# Patient Record
Sex: Female | Born: 1985 | Race: White | Hispanic: No | Marital: Married | Smoking: Former smoker
Health system: Southern US, Community
[De-identification: ages and names within clinical notes are randomized; demographics above are authoritative.]

## PROBLEM LIST (undated history)

## (undated) ENCOUNTER — Inpatient Hospital Stay (HOSPITAL_COMMUNITY): Payer: Self-pay

## (undated) DIAGNOSIS — T7840XA Allergy, unspecified, initial encounter: Secondary | ICD-10-CM

## (undated) DIAGNOSIS — F419 Anxiety disorder, unspecified: Secondary | ICD-10-CM

## (undated) HISTORY — PX: NO PAST SURGERIES: SHX2092

## (undated) HISTORY — DX: Allergy, unspecified, initial encounter: T78.40XA

## (undated) HISTORY — DX: Anxiety disorder, unspecified: F41.9

---

## 2006-07-02 ENCOUNTER — Other Ambulatory Visit: Admission: RE | Admit: 2006-07-02 | Discharge: 2006-07-02 | Payer: Self-pay | Admitting: Obstetrics and Gynecology

## 2006-11-01 ENCOUNTER — Inpatient Hospital Stay (HOSPITAL_COMMUNITY): Admission: AD | Admit: 2006-11-01 | Discharge: 2006-11-01 | Payer: Self-pay

## 2006-12-25 ENCOUNTER — Inpatient Hospital Stay (HOSPITAL_COMMUNITY): Admission: AD | Admit: 2006-12-25 | Discharge: 2006-12-27 | Payer: Self-pay | Admitting: Obstetrics and Gynecology

## 2008-02-23 ENCOUNTER — Encounter: Admission: RE | Admit: 2008-02-23 | Discharge: 2008-02-23 | Payer: Self-pay | Admitting: Emergency Medicine

## 2008-02-23 ENCOUNTER — Other Ambulatory Visit: Admission: RE | Admit: 2008-02-23 | Discharge: 2008-02-23 | Payer: Self-pay | Admitting: Diagnostic Radiology

## 2008-02-23 ENCOUNTER — Encounter (INDEPENDENT_AMBULATORY_CARE_PROVIDER_SITE_OTHER): Payer: Self-pay | Admitting: Diagnostic Radiology

## 2008-03-16 ENCOUNTER — Inpatient Hospital Stay (HOSPITAL_COMMUNITY): Admission: AD | Admit: 2008-03-16 | Discharge: 2008-03-17 | Payer: Self-pay | Admitting: Obstetrics and Gynecology

## 2008-08-30 ENCOUNTER — Encounter: Admission: RE | Admit: 2008-08-30 | Discharge: 2008-08-30 | Payer: Self-pay | Admitting: Emergency Medicine

## 2008-09-28 ENCOUNTER — Inpatient Hospital Stay (HOSPITAL_COMMUNITY): Admission: AD | Admit: 2008-09-28 | Discharge: 2008-09-28 | Payer: Self-pay | Admitting: Obstetrics and Gynecology

## 2009-01-21 ENCOUNTER — Inpatient Hospital Stay (HOSPITAL_COMMUNITY): Admission: AD | Admit: 2009-01-21 | Discharge: 2009-01-21 | Payer: Self-pay | Admitting: Obstetrics and Gynecology

## 2009-01-26 ENCOUNTER — Inpatient Hospital Stay (HOSPITAL_COMMUNITY): Admission: AD | Admit: 2009-01-26 | Discharge: 2009-01-28 | Payer: Self-pay | Admitting: Obstetrics and Gynecology

## 2009-07-07 ENCOUNTER — Encounter: Admission: RE | Admit: 2009-07-07 | Discharge: 2009-07-07 | Payer: Self-pay | Admitting: Emergency Medicine

## 2009-09-06 ENCOUNTER — Other Ambulatory Visit: Admission: RE | Admit: 2009-09-06 | Discharge: 2009-09-06 | Payer: Self-pay | Admitting: Interventional Radiology

## 2009-09-06 ENCOUNTER — Encounter (INDEPENDENT_AMBULATORY_CARE_PROVIDER_SITE_OTHER): Payer: Self-pay | Admitting: Interventional Radiology

## 2009-09-06 ENCOUNTER — Encounter: Admission: RE | Admit: 2009-09-06 | Discharge: 2009-09-06 | Payer: Self-pay | Admitting: Internal Medicine

## 2011-01-07 ENCOUNTER — Encounter: Payer: Self-pay | Admitting: Emergency Medicine

## 2011-04-02 LAB — CBC
HCT: 39.2 % (ref 36.0–46.0)
Hemoglobin: 13.4 g/dL (ref 12.0–15.0)
MCHC: 33.4 g/dL (ref 30.0–36.0)
MCHC: 34.1 g/dL (ref 30.0–36.0)
MCV: 89.8 fL (ref 78.0–100.0)
Platelets: 187 10*3/uL (ref 150–400)
Platelets: 208 10*3/uL (ref 150–400)
RBC: 3.59 MIL/uL — ABNORMAL LOW (ref 3.87–5.11)
RBC: 4.36 MIL/uL (ref 3.87–5.11)
RDW: 14.1 % (ref 11.5–15.5)
RDW: 14.3 % (ref 11.5–15.5)
WBC: 14.1 10*3/uL — ABNORMAL HIGH (ref 4.0–10.5)

## 2011-04-02 LAB — CREATININE, URINE, 24 HOUR
Collection Interval-UCRE24: 24 hours
Creatinine, 24H Ur: 1855 mg/d — ABNORMAL HIGH (ref 700–1800)
Creatinine, Urine: 89.2 mg/dL
Urine Total Volume-UCRE24: 2080 mL

## 2011-04-02 LAB — PROTEIN, URINE, 24 HOUR
Collection Interval-UPROT: 24 hours
Protein, 24H Urine: 62 mg/d (ref 50–100)
Protein, Urine: 3 mg/dL
Urine Total Volume-UPROT: 2080 mL

## 2011-04-02 LAB — RPR: RPR Ser Ql: NONREACTIVE

## 2011-04-30 NOTE — H&P (Signed)
NAMESHANTARA, GOOSBY          ACCOUNT NO.:  [id number]   MEDICAL RECORD NO.:  [id number]          PATIENT TYPE:  INP   LOCATION:  9171                          [facility]:  [facility]   PHYSICIAN:  Osborn Coho, M.D.   DATE OF BIRTH:  [birth year]   DATE OF ADMISSION:  01/26/2009  DATE OF DISCHARGE:                              HISTORY & PHYSICAL   HISTORY:  Ms. Townsel is a 25 year old gravida 3, para 1-0-1-1 at 39.[redacted]  weeks gestation.  She is followed by the midwives at [facility] and presents today in active labor with a cervix dilated 5-6 cm.  Ms. Coventry pregnancy is remarkable for:  1. Conception while breast feeding.  2. First trimester ovarian cyst.  3. First trimester spotting.   LABORATORY DATA:  Ms. Grimmett's prenatal labs include an initial  hemoglobin of 12.5, hematocrit 38.2, platelets 212,000.  Blood type O+,  antibody screen negative, RPR nonreactive, rubella titer immune,  hepatitis B negative, HIV nonreactive.  Cystic fibrosis screen negative.  Second trimester Glucola normal.  Second trimester hemoglobin 11.6.  Third trimester cultures for Chlamydia, gonorrhea and GBS all negative.   HISTORY OF PRESENT PREGNANCY:  Ms. Ozbun entered into OB care at 12  weeks.  She declined a first trimester screen and a quad screen.  At 18  weeks, she was treated for a yeast infection.  At 19.5 weeks, she had an  anatomy ultrasound that showed an estimated fetal weight of 11 ounces, a  posterior placenta with a three-vessel cord, a cervix 4.74 cm long and a  fluid pocket 5.41 cm.  All cardiac anatomy was seen and all fetal  anatomy on the scan was normal.  At 23.6 weeks, she had an episode of  back pain that did not seem to be precipitated by any activity.  She was  given some ibuprofen and Flexeril.  Urine culture was sent which came  back negative.  She declined H1N1 vaccination.  She had her Glucola done  at 28.5 weeks.  It came back normal with a  hemoglobin at that time of  11.6 and a negative RPR as well.  Her Glucola was 124.  At 33 weeks, she  got a refill on her Flexeril which she was taking for back pain and her  pregnancy progressed without any further problems.  She had negative  cultures for chlamydia, gonorrhea and GBS.  On December 20, 2008 at 36  weeks, she states she began contracting in the middle of the night and  intensity and frequency picked up this morning.   CURRENT MEDICATIONS:  1. Prenatal vitamin.  2. Flexeril p.r.n.   OBSTETRICAL HISTORY:  Gravida 1 resulted in the birth of a son at 16  weeks named [place].  He weighed 8 pounds 9 ounces.  Rhett Bannister 2 was a  spontaneous first trimester loss in April 2009.  Gravida 3 is current  pregnancy.   ALLERGIES:  Ms. Scherger is sensitive to HYDROCODONE which gives her  severe nausea and vomiting.  She does not have any other drug allergies,  latex allergies or food allergies.   MEDICAL HISTORY:  Ms. Wiater medical history includes having some  thyroid nodules in the past.  She did see an endocrinologist and had  normal TSH levels on evaluation in April of last year.  She has no other  ongoing medical problems.   SURGICAL HISTORY:  She has never had surgery.   FAMILY HISTORY:  Her maternal grandfather and paternal grandfather both  have cardiac disease.  Her mother has chronic hypertension as well as  anemia, COPD and thyroid problems.  Her mother also had a kidney  transplant 10 years ago and is currently on dialysis.  Her mother had  breast cancer at age 73 before menopause.  There are no other health  problems in her family.   GENETIC HISTORY:  The patient has a negative cystic fibrosis screen and  as mentioned family history of premenopausal breast cancer.  Father of  the baby has no contributing genetic factors.   SOCIAL HISTORY:  Ms. Nilsen is married to [employer]  They are  both Caucasian.  They state their religion as [place].  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  Her husband has a  high school education and some college, and works for [employer].  The  patient denies use of alcohol, tobacco or street drugs during pregnancy.   PHYSICAL EXAMINATION:  GENERAL:  Her physical exam is normal today.  VITAL SIGNS:  She is afebrile with stable vital signs.  HEENT:  Within normal limits.  LUNGS:  Clear to auscultation.  HEART:  Regular rate and rhythm without murmur.  BREASTS:  Soft, nontender.  ABDOMEN:  Soft and nontender with a gravid uterus with a fundal height  appropriate for gestational age and a soft resting tone between  contractions.  EXTREMITIES:  There is some mild peripheral edema of the upper  extremities only.  No edema of the lower extremities.  DTRs +2 +2,  negative Homans and clonus.  GU:  Cervix is 5-6 cm dilated, 100% effaced, -3 station, slightly  posterior, very soft and stretchy.  Fetal heart rate 140, very reactive,  very active fetus.  Contractions every 3-5 minutes lasting 60-100  seconds and very strong.   IMPRESSION:  A 25 year old G3, P 1-0-1-1 at 39.5 weeks active labor,  reassuring fetal heart rate.   PLAN:  Admit to birthing suite for CNM management and routine orders.  Dr. Su Hilt notified.      Eulogio Bear, CNM      Osborn Coho, M.D.  Electronically Signed    JM/MEDQ  D:  01/26/2009  T:  01/26/2009  Job:  [id number]

## 2011-05-03 NOTE — H&P (Signed)
April Sanchez, April Sanchez            ACCOUNT NO.:  [id number]   MEDICAL RECORD NO.:  [id number]          PATIENT TYPE:  MAT   LOCATION:  MATC                          [facility]ILITY:  [facility]   PHYSICIAN:  Crist Fat. Rivard, M.D. DATE OF BIRTH:  24-Feb-1986   DATE OF ADMISSION:  12/25/2006  DATE OF DISCHARGE:                              HISTORY & PHYSICAL   HISTORY OF PRESENT ILLNESS:  Ms. April Sanchez is a 25 year old gravida 1,  para 0 at 39-4/7 weeks who presents today with uterine contractions  every 5 minutes at home times several hours.  The patient denies any  leaking or bleeding. She reports positive fetal movement.  Pregnancy has  been remarkable for:  1. Questionable last menstrual period.  2. First trimester bleeding.   LABORATORY DATA:  Prenatal labs:  Blood type is O positive, RH antibody  negative, VDRL nonreactive, Rubella titer positive, hepatitis B surface  antigen negative.  HIV nonreactive.  Cystic fibrosis testing negative.  Gonorrhea and Chlamydia cultures were negative in July.  Pap smear was  normal.  Hemoglobin upon entering practice was 13.7; it was 12.4 at 27  weeks.  Glucola was normal.  The patient declined quadruple screen.  Group B Strep culture was negative at 36 weeks.  EDC of 12/28/2006 was  established by 14-week ultrasound.   OBSTETRICAL HISTORY:  The patient entered care at 14 weeks.  Ultrasound  was done at that time secondary to questionable dating.  She declined  quadruple screen.  She had an ultrasound at 19 weeks showing normal  growth and development.  Placenta was anterior.  She was having some  issues with hemorrhoids and varicose veins in her right leg.  These were  stable throughout the rest of her pregnancy.  Her Glucola was normal.  She did have some reflux and was placed on Zantac.  The rest of her  pregnancy was essentially uncomplicated.  The patient is a primigravida.   PAST MEDICAL HISTORY:  She has no history of diabetes, cardiovascular  disease, hypertension or any other problems.  She has never had surgery.   ALLERGIES:  She has no known medication allergies.   FAMILY HISTORY:  Maternal grandfather and paternal grandfather had heart  disease.  Mother has hypertension.  Maternal grandmother has anemia.  Mother and maternal grandmother had chronic obstructive pulmonary  disease.  Her mother has thyroid problems.  Mother also had a kidney  transplant 10 years ago and now is on dialysis.  Her mother also has  premenopausal breast cancer.   SOCIAL HISTORY:  The patient is single.  Father of the baby is involved  and supportive.  His name is April Sanchez.  The patient is Caucasian  of the [place] faith.  She has one year of college.  She is employed  at a [employer].  Her partner also has some college and he is also  employed at the [employer].  She has been followed by the Certified  Nurse Midwife Service at [facility].  She denies any alcohol,  drug or tobacco use during this pregnancy.   PHYSICAL EXAMINATION:  VITAL SIGNS:  Stable.  The patient is afebrile.  HEENT:  Within normal limits.  LUNGS:  Lungs are essentially clear.  HEART:  Regular rate and rhythm without murmurs.  BREASTS:  Soft, nontender.  ABDOMEN:  Fundal height is approximately 38 cm.  Estimated fetal weight  is 7 to 7-1/2 pounds.  Uterine contractions are every two to five  minutes, 40 to 60 seconds in duration, moderate to strong quality.  Fetal heart rate is reactive with no decelerations.  PELVIC: Cervix is 4 to 5 cm, 90%, vertex, minus 1 station with intact  bag of water.  EXTREMITIES:  Deep tendon reflexes are 2+ without clonus.  There is a  trace edema noted.   IMPRESSION:  1. Intrauterine pregnancy at 39-4/7 weeks.  2. Early active labor.  3. Negative Group B Strep.   PLAN:  1. Admit to birthing suite for consult with Dr. Estanislado Pandy as attending      physician.  2. Routine Certified Nurse Midwife orders.  3. Pain  medication p.r.n.      April Sanchez, C.N.M.      Crist Fat Rivard, M.D.  Electronically Signed    VLL/MEDQ  D:  12/25/2006  T:  12/25/2006  Job:  [id number]

## 2011-07-01 ENCOUNTER — Emergency Department: Payer: Self-pay | Admitting: Unknown Physician Specialty

## 2011-09-10 LAB — CBC
HCT: 36.2
Hemoglobin: 12.6
MCV: 88.6
RBC: 4.09
WBC: 9.2

## 2012-09-14 ENCOUNTER — Telehealth: Payer: Self-pay

## 2012-09-14 NOTE — Telephone Encounter (Signed)
Dr. Milus Glazier   Pt is requesting we contact her childrens Dr. Eddie Candle  [phone] to verify they have strep, then call meds for [facility] to [facility] [address] in [place]   [phone]  Cell phone   Verified this is the correct phone number for patient.

## 2012-09-14 NOTE — Telephone Encounter (Signed)
Pt states she sees dr l or daub. States both of her children have strep and she is having symptoms. Pt has no insurance and cannot afford an ov currently. Asks dr l or daub if they can write her an antibiotic in case she has strep. notified her we usually require ov to rx an antibiotic but she would like to ask them in case they can help her.   Pt best: [phone] [facility] [address] in [place]  bf

## 2012-09-15 NOTE — Telephone Encounter (Signed)
Advised patient she would need to be seen in our office.  Patient stated that Dr. Milus Glazier has treated her in the past.  Advised patient he is out of the office and we will be unable to give her antibiotic, she must be seen. Patient states understanding.   Patient has not been seen in our office since 01/2009.

## 2012-10-31 ENCOUNTER — Ambulatory Visit: Payer: Self-pay | Admitting: Family Medicine

## 2012-10-31 VITALS — BP 119/82 | HR 76 | Temp 97.4°F | Resp 18 | Ht 66.0 in | Wt 158.0 lb

## 2012-10-31 DIAGNOSIS — B085 Enteroviral vesicular pharyngitis: Secondary | ICD-10-CM

## 2012-10-31 DIAGNOSIS — J029 Acute pharyngitis, unspecified: Secondary | ICD-10-CM

## 2012-10-31 DIAGNOSIS — T17208A Unspecified foreign body in pharynx causing other injury, initial encounter: Secondary | ICD-10-CM

## 2012-10-31 DIAGNOSIS — T700XXA Otitic barotrauma, initial encounter: Secondary | ICD-10-CM

## 2012-10-31 MED ORDER — AMOXICILLIN 875 MG PO TABS: 875.0000 mg | ORAL_TABLET | Freq: Two times a day (BID) | ORAL | Status: DC

## 2012-10-31 NOTE — Progress Notes (Signed)
Patient one another physician opinion and Dr. Dareen Piano asked me to see her. The history as noted above. She has a foreign or food bulk sensation in the back of her throat on the left. She's been reaching back they're digging with her finger.  Objective: She has ecchymosis on the soft palate were she's been digging at herself. Her tonsils are red and swollen. I used the indirect laryngoscope to look back down into her throat. See down into the epiglottis and pharyngeal area area. There is no foreign body or food particles there. Is able to look behind the tonsils, and on the left also did see some thick white mucus stuck in the grip of the tonsil. I think that is what she is feeling. She was anesthetized with Xylocaine. I was able to use a Q-tip and remove some of the thick mucus.  Assessment: Mucus body in tonsillar crypt Pharyngitis secondary to digital manipulation by patient  Plan: Decided to put her on amoxicillin 875 twice a day If symptoms persist she is to see him is that Dr.

## 2012-10-31 NOTE — Progress Notes (Signed)
[facility] [address] [place] [address] [phone]  Date:  10/31/2012   Name:  April Sanchez   DOB:  [birth year]   MRN:  [id number]  PCP:  No primary provider on file.    Chief Complaint: food stuck in throat x 3 days   History of Present Illness:  April Sanchez is a 26 y.o. very pleasant female patient who presents with the following:  For the past three days has felt a "ball of food" stuck in the back of her throat on the left side.  She claims to be able to feel the food bolus with her finger transorally.  She denies waterbrash or heartburn or indigestion. No use ASA or NSAID.  Uses caffeine but no alcohol.  Says this has happened previously and the bolus of food either is regurgitated or passes down her esophagus.  Has swallowed liquids since this food is stuck.  Says that she has eaten sparingly and thinks it may be something like a cracker.  Denies fish or chicken as a meal.  Had a URI past several days with nasal drainage and cough and no fever.  Symptoms have resolved.  She is convinced that her throat is swollen from the infection and that is what has provoked this episode.  She claims to have instrumented her throat with qtips and her fingers and expressed small amounts of food from a hidden location behind and beneath a tonsil.  There is no problem list on file for this patient.   No past medical history on file.  No past surgical history on file.  History  Substance Use Topics  . Smoking status: Former Games developer  . Smokeless tobacco: Not on file  . Alcohol Use: Not on file    No family history on file.  No Known Allergies  Medication list has been reviewed and updated.  No current outpatient prescriptions on file prior to visit.    Review of Systems:  As per HPI, otherwise negative.    Physical Examination: Filed Vitals:   10/31/12 1804  BP: 119/82  Pulse: 76  Temp: 97.4 F (36.3 C)  Resp: 18   Filed  Vitals:   10/31/12 1804  Height: 5\' 6"  (1.676 m)  Weight: 158 lb (71.668 kg)   Body mass index is 25.50 kg/(m^2). Ideal Body Weight: Weight in (lb) to have BMI = 25: 154.6   GEN: WDWN, NAD, Non-toxic, A & O x 3 HEENT: Atraumatic, Normocephalic. Neck supple. No masses, No LAD.  Oropharynx negative.  No foreign body or swelling.  Tonsils enlarged but not inflammed Ears and Nose: No external deformity. CV: RRR, No M/G/R. No JVD. No thrill. No extra heart sounds. PULM: CTA B, no wheezes, crackles, rhonchi. No retractions. No resp. distress. No accessory muscle use. ABD: S, NT, ND, +BS. No rebound. No HSM. EXTR: No c/c/e NEURO Normal gait.  PSYCH: Normally interactive. Conversant. Not depressed or anxious appearing.  Calm demeanor.    Assessment and Plan: Possible oropharyngeal foreign body Recommended that she go to the ER for further evaluation and fiberoptic endoscopy.  She refused due to financial concerns. Refer to ENT  Carmelina Dane, MD

## 2012-10-31 NOTE — Addendum Note (Signed)
Addended by: HOPPER, DAVID H on: 10/31/2012 07:17 PM   Modules accepted: Orders

## 2012-10-31 NOTE — Patient Instructions (Addendum)
Drink lots of fluids  Take antibiotics  If symptoms persist see her nose and throat doctor  Use lozenges if needed just to soothe the symptoms.

## 2013-03-19 ENCOUNTER — Ambulatory Visit: Payer: Self-pay

## 2013-04-30 ENCOUNTER — Ambulatory Visit (INDEPENDENT_AMBULATORY_CARE_PROVIDER_SITE_OTHER): Payer: BC Managed Care – PPO | Admitting: Emergency Medicine

## 2013-04-30 VITALS — BP 118/80 | HR 91 | Temp 98.7°F | Resp 16 | Ht 66.5 in | Wt 146.0 lb

## 2013-04-30 DIAGNOSIS — E041 Nontoxic single thyroid nodule: Secondary | ICD-10-CM

## 2013-04-30 DIAGNOSIS — J020 Streptococcal pharyngitis: Secondary | ICD-10-CM

## 2013-04-30 MED ORDER — PENICILLIN V POTASSIUM 500 MG PO TABS: 500.0000 mg | ORAL_TABLET | Freq: Four times a day (QID) | ORAL | Status: DC

## 2013-04-30 NOTE — Patient Instructions (Addendum)

## 2013-04-30 NOTE — Progress Notes (Signed)
[facility] [address], [place] [address] [phone]  Date:  04/30/2013   Name:  April Sanchez   DOB:  November 23, 1986   MRN:  [id number]  PCP:  No primary provider on file.    Chief Complaint: Sore Throat   History of Present Illness:  Bridgette Wolden Bannister is a 27 y.o. very pleasant female patient who presents with the following:  Ill today with sore throat.  No fever or chills.  Fatigued.  No coryza or cough, nausea or vomiting.  No rash.  Children treated for strep four days ago.  History of thyroid nodules biopsied in past following ultrasound. Now has insurance again and wants to be checked.  Not symptomatic  No improvement with over the counter medications or other home remedies. Denies other complaint or health concern today.   There are no active problems to display for this patient.   Past Medical History  Diagnosis Date  . Allergy     History reviewed. No pertinent past surgical history.  History  Substance Use Topics  . Smoking status: Former Games developer  . Smokeless tobacco: Not on file  . Alcohol Use: Not on file    Family History  Problem Relation Age of Onset  . Arthritis Mother   . Cancer Mother     breast  . Kidney failure Mother   . Arthritis Father     No Known Allergies  Medication list has been reviewed and updated.  Current Outpatient Prescriptions on File Prior to Visit  Medication Sig Dispense Refill  . amoxicillin (AMOXIL) 875 MG tablet Take 1 tablet (875 mg total) by mouth 2 (two) times daily.  14 tablet  0   No current facility-administered medications on file prior to visit.    Review of Systems:  As per HPI, otherwise negative.    Physical Examination: Filed Vitals:   04/30/13 1448  BP: 118/80  Pulse: 91  Temp: 98.7 F (37.1 C)  Resp: 16   Filed Vitals:   04/30/13 1448  Height: 5' 6.5" (1.689 m)  Weight: 146 lb (66.225 kg)   Body mass index is 23.21 kg/(m^2). Ideal Body Weight: Weight in  (lb) to have BMI = 25: 156.9  GEN: WDWN, NAD, Non-toxic, A & O x 3 HEENT: Atraumatic, Normocephalic. Neck supple. Thyroid enlarged and palpable easily, No LAD.  Erythematous posterior pharynx Ears and Nose: No external deformity. CV: RRR, No M/G/R. No JVD. No thrill. No extra heart sounds. PULM: CTA B, no wheezes, crackles, rhonchi. No retractions. No resp. distress. No accessory muscle use. ABD: S, NT, ND, +BS. No rebound. No HSM. EXTR: No c/c/e NEURO Normal gait.  PSYCH: Normally interactive. Conversant. Not depressed or anxious appearing.  Calm demeanor.    Assessment and Plan: Pharyngitis Thyromegaly Sonogram Thyroid studies   Signed,  Phillips Odor, MD

## 2013-05-14 ENCOUNTER — Ambulatory Visit
Admission: RE | Admit: 2013-05-14 | Discharge: 2013-05-14 | Disposition: A | Discharge status: Home / Self Care | Payer: BC Managed Care – PPO | Source: Ambulatory Visit | Attending: Emergency Medicine | Admitting: Emergency Medicine

## 2013-05-14 DIAGNOSIS — E041 Nontoxic single thyroid nodule: Secondary | ICD-10-CM

## 2013-05-20 ENCOUNTER — Telehealth: Payer: Self-pay

## 2013-05-20 NOTE — Telephone Encounter (Signed)
Pt called for results of her xray that she was referred out to. [phone]

## 2013-05-21 NOTE — Telephone Encounter (Signed)
Please review Dr Dareen Piano and we will contact pt. Thanks!

## 2013-05-21 NOTE — Telephone Encounter (Signed)
Really unchanged from previous study.

## 2013-05-22 NOTE — Telephone Encounter (Signed)
Patient called again for thyroid u/s results. Needs a clinical staff to call her back. [phone]. It has been over 24 hrs since she has been waiting.

## 2013-05-23 ENCOUNTER — Telehealth: Payer: Self-pay

## 2013-05-23 NOTE — Telephone Encounter (Signed)
Pt is returning a missed call Call back number is [phone]

## 2013-05-23 NOTE — Telephone Encounter (Signed)
Thanks patient advised.  

## 2013-05-24 NOTE — Telephone Encounter (Signed)
She has been advised of the [place] results

## 2013-05-27 ENCOUNTER — Ambulatory Visit (INDEPENDENT_AMBULATORY_CARE_PROVIDER_SITE_OTHER): Payer: BC Managed Care – PPO | Admitting: Emergency Medicine

## 2013-05-27 ENCOUNTER — Encounter: Payer: Self-pay | Admitting: Emergency Medicine

## 2013-05-27 VITALS — BP 122/74 | HR 80 | Temp 99.3°F | Resp 16 | Ht 66.0 in | Wt 136.0 lb

## 2013-05-27 DIAGNOSIS — E049 Nontoxic goiter, unspecified: Secondary | ICD-10-CM

## 2013-05-27 DIAGNOSIS — J029 Acute pharyngitis, unspecified: Secondary | ICD-10-CM

## 2013-05-27 DIAGNOSIS — E042 Nontoxic multinodular goiter: Secondary | ICD-10-CM

## 2013-05-27 DIAGNOSIS — R599 Enlarged lymph nodes, unspecified: Secondary | ICD-10-CM

## 2013-05-27 DIAGNOSIS — L989 Disorder of the skin and subcutaneous tissue, unspecified: Secondary | ICD-10-CM

## 2013-05-27 DIAGNOSIS — R590 Localized enlarged lymph nodes: Secondary | ICD-10-CM

## 2013-05-27 LAB — POCT CBC
Granulocyte percent: 70.3 %G (ref 37–80)
Lymph, poc: 1.9 (ref 0.6–3.4)
MCHC: 32.1 g/dL (ref 31.8–35.4)
MID (cbc): 0.5 (ref 0–0.9)
MPV: 9.2 fL (ref 0–99.8)
POC Granulocyte: 5.5 (ref 2–6.9)
POC LYMPH PERCENT: 23.8 %L (ref 10–50)
POC MID %: 5.9 %M (ref 0–12)
Platelet Count, POC: 284 10*3/uL (ref 142–424)
RDW, POC: 13.4 %

## 2013-05-27 LAB — POCT RAPID STREP A (OFFICE): Rapid Strep A Screen: NEGATIVE

## 2013-05-27 NOTE — Patient Instructions (Addendum)
1. Recheck mole in 3 months  2. Referral to endo Dr Sharl Ma- our office or Dr Daune Perch office will call with appointment date and time  Cervical Sprain A cervical sprain is when the ligaments in the neck stretch or tear. The ligaments are the tissues that hold the neck bones in place. HOME CARE   Put ice on the injured area.  Put ice in a plastic bag.  Place a towel between your skin and the bag.  Leave the ice on for 15-20 minutes, 3-4 times a day.  Only take medicine as told by your doctor.  Keep all doctor visits as told.  Keep all physical therapy visits as told.  If your doctor gives you a neck collar, wear it as told.  Do not drive while wearing a neck collar.  Adjust your work station so that you have good posture while you work.  Avoid positions and activities that make your problems worse.  Warm up and stretch before being active. GET HELP RIGHT AWAY IF:   You are bleeding or your stomach is upset.  You have an allergic reaction to your medicine.  Your problems (symptoms) get worse.  You develop new problems.  You lose feeling (numbness) or you cannot move (paralysis) any part of your body.  You have tingling or weakness in any part of your body.  Your pain is not controlled with medicine.  You cannot take less pain medicine over time as planned.  Your activity level does not improve as expected. MAKE SURE YOU:   Understand these instructions.  Will watch your condition.  Will get help right away if you are not doing well or get worse. Document Released: 05/20/2008 Document Revised: 02/24/2012 Document Reviewed: 09/05/2011 [facility] Patient Information 2014 [place], [place].

## 2013-05-27 NOTE — Progress Notes (Signed)
  Subjective:    Patient ID: April Sanchez, female    DOB: 07-24-1986, 27 y.o.   MRN: [id number]  HPI  27 year old female presents with:   2 lumps on right side of neck x 1 week; not sore or tender to the touch presently; tender to the touch the first 2 days Strained neck 2 weeks before she felt lumps develop.  Took dog for a walk, dog was pulling.  Went for a [employer] and the next day the lumps developed She feels tired Occasional sharp pain in throat at thyroid No trouble swallowing  Has a goiter Recent thyroid US-no change from study from 2010. Thyroid Biopsy 2010  Patient states she has lost weight, a little thinning of hair, no constipation or diarrhea, no skin changes  Has a red raised mole on right side of chest; it "seems bigger" 2mm     Review of Systems     Objective:   Physical Exam patient is alert and cooperative in no distress. There 2 small lymph nodes right posterior cervical chain which are not hard and not fixed. The thyroid is definitely enlarged and nodular. Her neck is otherwise supple. Deep tendon reflexes of the upper extremities are 2+. Motor strength is 5 out of 5. Examination of the skin reveals a 2 mm raised area which is red and in the 11:00 position right breast   Results for orders placed in visit on 05/27/13  POCT CBC      Result Value Range   WBC 7.8  4.6 - 10.2 K/uL   Lymph, poc 1.9  0.6 - 3.4   POC LYMPH PERCENT 23.8  10 - 50 %L   MID (cbc) 0.5  0 - 0.9   POC MID % 5.9  0 - 12 %M   POC Granulocyte 5.5  2 - 6.9   Granulocyte percent 70.3  37 - 80 %G   RBC 4.68  4.04 - 5.48 M/uL   Hemoglobin 14.2  12.2 - 16.2 g/dL   HCT, POC 40.9  81.1 - 47.9 %   MCV 94.4  80 - 97 fL   MCH, POC 30.3  27 - 31.2 pg   MCHC 32.1  31.8 - 35.4 g/dL   RDW, POC 91.4     Platelet Count, POC 284  142 - 424 K/uL   MPV 9.2  0 - 99.8 fL  POCT RAPID STREP A (OFFICE)      Result Value Range   Rapid Strep A Screen Negative  Negative        Assessment  & Plan:  Referral to be made to Dr. Sharl Ma. Recheck lesion on her right breast in 3 months. Labs done and pending. She was given exercises for her neck.

## 2013-05-28 LAB — COMPREHENSIVE METABOLIC PANEL
ALT: 9 U/L (ref 0–35)
CO2: 22 mEq/L (ref 19–32)
Chloride: 104 mEq/L (ref 96–112)
Sodium: 139 mEq/L (ref 135–145)
Total Bilirubin: 0.4 mg/dL (ref 0.3–1.2)
Total Protein: 7.6 g/dL (ref 6.0–8.3)

## 2013-09-23 ENCOUNTER — Ambulatory Visit: Payer: BC Managed Care – PPO

## 2013-09-23 ENCOUNTER — Ambulatory Visit (INDEPENDENT_AMBULATORY_CARE_PROVIDER_SITE_OTHER): Payer: BC Managed Care – PPO | Admitting: Emergency Medicine

## 2013-09-23 VITALS — BP 114/68 | HR 80 | Temp 98.0°F | Resp 16 | Ht 65.0 in | Wt 150.2 lb

## 2013-09-23 DIAGNOSIS — Z733 Stress, not elsewhere classified: Secondary | ICD-10-CM

## 2013-09-23 DIAGNOSIS — L709 Acne, unspecified: Secondary | ICD-10-CM

## 2013-09-23 DIAGNOSIS — F439 Reaction to severe stress, unspecified: Secondary | ICD-10-CM

## 2013-09-23 DIAGNOSIS — R9431 Abnormal electrocardiogram [ECG] [EKG]: Secondary | ICD-10-CM

## 2013-09-23 DIAGNOSIS — L710 Perioral dermatitis: Secondary | ICD-10-CM

## 2013-09-23 DIAGNOSIS — R079 Chest pain, unspecified: Secondary | ICD-10-CM

## 2013-09-23 DIAGNOSIS — L708 Other acne: Secondary | ICD-10-CM

## 2013-09-23 DIAGNOSIS — L719 Rosacea, unspecified: Secondary | ICD-10-CM

## 2013-09-23 DIAGNOSIS — I8390 Asymptomatic varicose veins of unspecified lower extremity: Secondary | ICD-10-CM

## 2013-09-23 MED ORDER — LORAZEPAM 0.5 MG PO TABS: ORAL_TABLET | ORAL | Status: DC

## 2013-09-23 MED ORDER — MUPIROCIN 2 % EX OINT: TOPICAL_OINTMENT | Freq: Two times a day (BID) | CUTANEOUS | Status: DC

## 2013-09-23 NOTE — Progress Notes (Addendum)
[address]   [place], [place]  [address]   [phone]  Subjective:    Patient ID: April Sanchez, female    DOB: [birth year], 27 y.o.   MRN: [id number]  This chart was scribed for Lesle Chris, MD by Blanchard Kelch, ED Scribe. The patient was seen in room 12. Patient's care was started at 3:35 PM.   HPI  April Sanchez is a 27 y.o. female who presents to office complaining of waxing and waning chest wall pain that began about three weeks ago. She describes the pain as tight and dull. She has been trying to take it easy in case it was a muscular strain but the pain has not subsided. She has not been taking Tylenol or Ibuprofen for the pain. She took a baby aspirin last night. She denies any wheezing or shortness of breath, however she sometimes feels like she can't breathe deeply. She is a former smoker but quit a few years ago. She denies history of diabetes, hypertension or high blood pressure. She denies a family history of lung or heart problems. 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 She denies any recent travel. She currently has a non-hormonal IUD in place. She had an ultrasound done in June on her thyroid because she has nodules but nothing had changed from a study done in 2012.   Past Medical History  Diagnosis Date  . Allergy   . Anxiety   No past surgical history on file. Family History  Problem Relation Age of Onset  . Arthritis Mother   . Cancer Mother     breast  . Kidney failure Mother   . Arthritis Father    History   Social History  . Marital Status: Married    Spouse Name: N/A    Number of Children: N/A  . Years of Education: N/A   Occupational History  . Not on file.   Social History Main Topics  . Smoking status: Former Games developer  . Smokeless tobacco: Not on file  . Alcohol Use: Yes  . Drug Use: No  . Sexual Activity: Not on file   Other Topics Concern  . Not on file   Social History Narrative  . No narrative on file   No  Known Allergies No current outpatient prescriptions on file prior to visit.   No current facility-administered medications on file prior to visit.       Review of Systems  Respiratory: Positive for chest tightness. Negative for shortness of breath and wheezing.        Objective:   Physical Exam  Constitutional: She is oriented to person, place, and time. She appears well-developed and well-nourished.  Neck: Normal range of motion. Neck supple. Thyromegaly present.  Nodular thyroid with nodules on right, middle and left.  Cardiovascular: Normal rate and regular rhythm.   Pulmonary/Chest: Effort normal and breath sounds normal. No respiratory distress. She has no wheezes.  Musculoskeletal: Normal range of motion.  Neurological: She is alert and oriented to person, place, and time.  Skin: Skin is warm and dry.          Assessment & Plan:   Orders Placed This Encounter  Procedures  . DG Chest 2 View    Standing Status: Future     Number of Occurrences: 1     Standing Expiration Date: 11/23/2014    Order Specific Question:  Reason for Exam (SYMPTOM  OR DIAGNOSIS REQUIRED)  Answer:  Chest pain    Order Specific Question:  Is the patient pregnant?    Answer:  No    Order Specific Question:  Preferred imaging location?    Answer:  External  . EKG 12-Lead   Problem List Items Addressed This Visit   None    Visit Diagnoses   Chest pain    -  Primary    Relevant Orders       DG Chest 2 View       EKG 12-Lead      [facility] reading (PRIMARY) by  Dr.Netasha Wehrli no acute disease  EKG a short PR interval with sinus bradycardia    I personally performed the services described in this documentation, which was scribed in my presence. The recorded information has been reviewed and is accurate.  I feel her chest pain is musculoskeletal in origin. She did have tenderness at the left second rib attachment to the sternum. I will place her on Aleve 1-2 twice a day. She did have a short  PR interval on her EKG and I have referred her to the cardiologist for this. She also had a bradycardia. She did not give any symptoms of tachybradycardia syndrome however she does say she feels anxious at times and is requesting something for stress. She has a 81-year-old and six-year-old boys who are very active in the time she gets very stressed out about this. She also had a perioral dermatitis I put her on medication for this.

## 2013-09-23 NOTE — Progress Notes (Addendum)
Plan dicussed with patient:  Patient advised to see Vein clinic Dr Consuela Mimes for varicose veins Patient referred to cardiology, Dr Jacinto Halim for her chest pain and abnormal EKG, expect pain is musculoskeletal in nature.  Situational anxiety, stressors include 28 yr old and 74 yr old sons, difficult to control 63 yr old. Patient indicates she feels stressed daily, and states she does not handle the stress very well. Patient advised medication to take prn may be helpful with this.  Bactroban for the acne around her mouth.    Meds ordered this encounter  Medications  . levonorgestrel (MIRENA) 20 MCG/24HR IUD    Sig: 1 each by Intrauterine route once.  Marland Kitchen LORazepam (ATIVAN) 0.5 MG tablet    Sig: Take one tablet daily as needed for stress    Dispense:  30 tablet    Refill:  1  . mupirocin ointment (BACTROBAN) 2 %    Sig: Apply topically 2 (two) times daily.    Dispense:  22 g    Refill:  0   I feel the patient's chest pain is musculoskeletal in origin. She is to take Aleve one to 2 twice a day. Referral has been made to cardiology for their evaluation. She does have a short PR interval and may need some further cardiovascular evaluation. She was referred to Dr. Guss Bunde for vein therapy. And is given Bactroban for perioral dermatitis   [facility] reading (PRIMARY) by  Dr. Cleta Alberts chest x-ray shows no acute disease

## 2013-09-23 NOTE — Patient Instructions (Signed)
Take Aleve 2 twice daily for the chest pain Use bactroban for the acne 2 times daily this will coat the area Take the Ativan sparingly, this can be habit forming if taken too often You will get a phone call for the cardiologist and the vein specialist  If your chest pain worsens or if you have any shortness of breath, go to the Emergency Room

## 2013-10-21 ENCOUNTER — Other Ambulatory Visit: Payer: Self-pay

## 2013-10-21 DIAGNOSIS — L710 Perioral dermatitis: Secondary | ICD-10-CM

## 2013-10-21 NOTE — Telephone Encounter (Signed)
Patient was given an ointment for her face by Dr Cleta Alberts. It didn't have any refills and she would like the rx sent to her pharmacy. Uses [facility]. Cb# [phone]

## 2013-10-21 NOTE — Telephone Encounter (Signed)
Pended please advise.  

## 2013-10-25 ENCOUNTER — Other Ambulatory Visit: Payer: Self-pay | Admitting: Emergency Medicine

## 2013-10-25 MED ORDER — MUPIROCIN 2 % EX OINT: TOPICAL_OINTMENT | Freq: Two times a day (BID) | CUTANEOUS | Status: DC

## 2013-12-27 ENCOUNTER — Ambulatory Visit: Payer: BC Managed Care – PPO | Admitting: Cardiology

## 2014-02-27 IMAGING — US US SOFT TISSUE HEAD/NECK
1 series · 14 of 25 positions shown · non-contrast
Comparison: Multiple prior thyroid ultrasound studies from 9888
and 8202.

CLINICAL DATA: History multinodular thyroid goiter and status post
prior bilateral thyroid nodule biopsy on 09/06/2009 as well as
02/23/2008.

THYROID ULTRASOUND
TECHNIQUE: Ultrasound examination of the thyroid gland and adjacent
soft tissues was performed.

[Series 1: us soft tissue head/neck · 0.08mm/px · 14 of 52 slices shown]
[im 1/52]
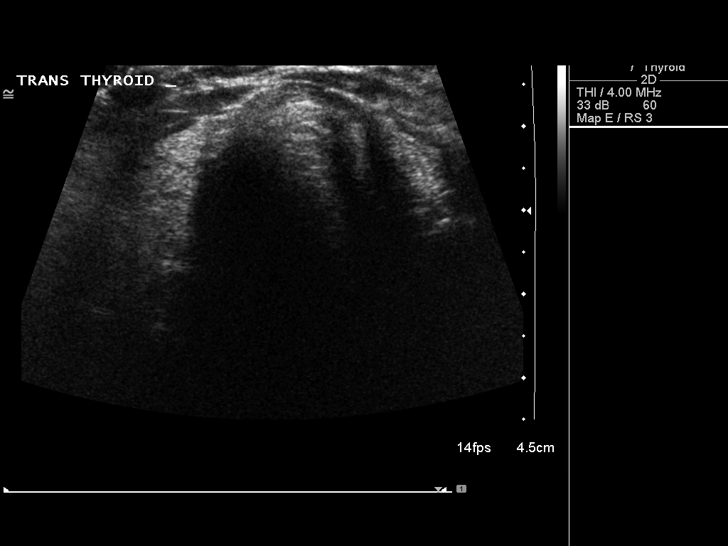
[im 5/52]
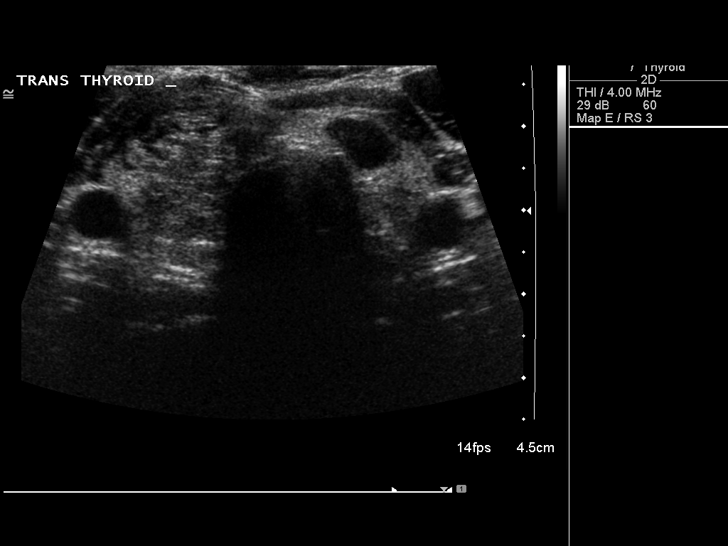
[im 9/52]
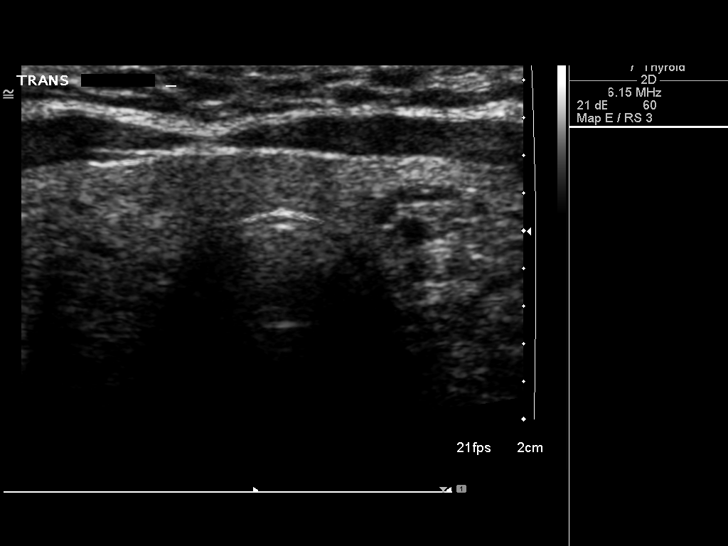
[im 13/52]
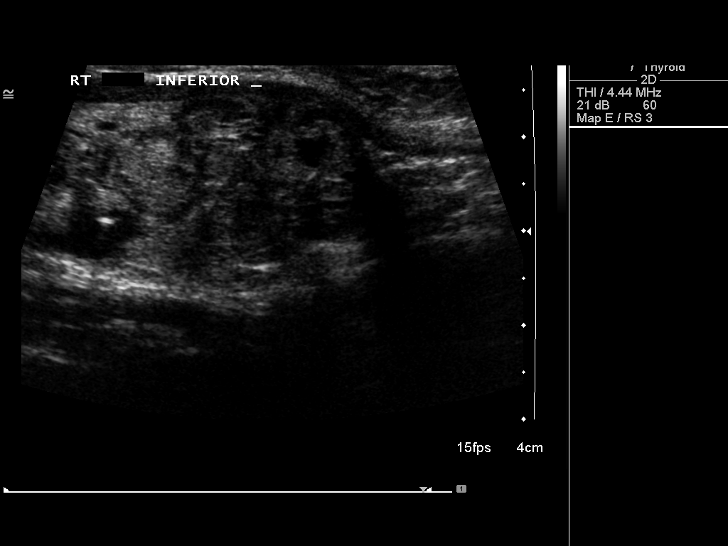
[im 18/52]
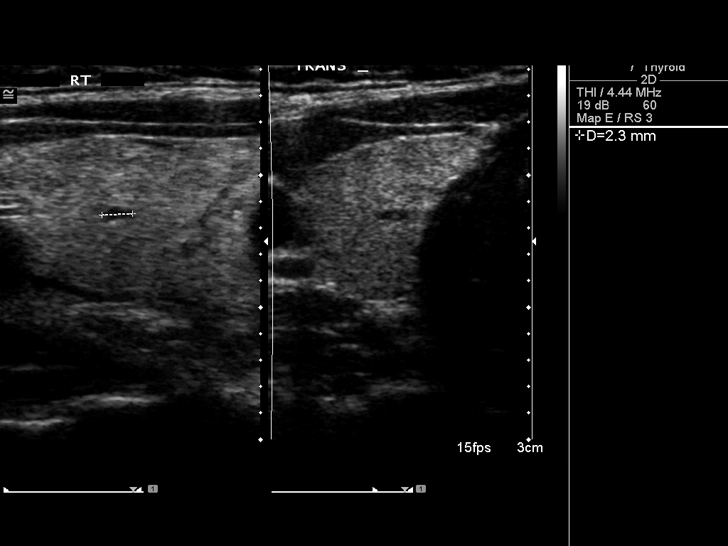
[im 20/52]
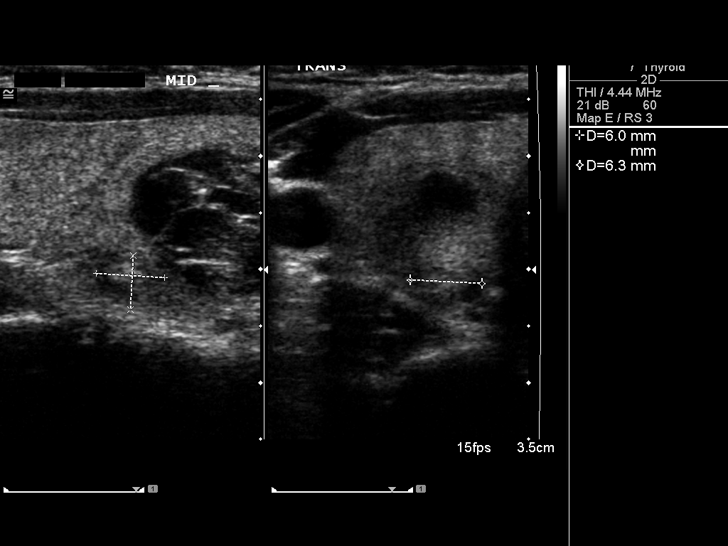
[im 24/52]
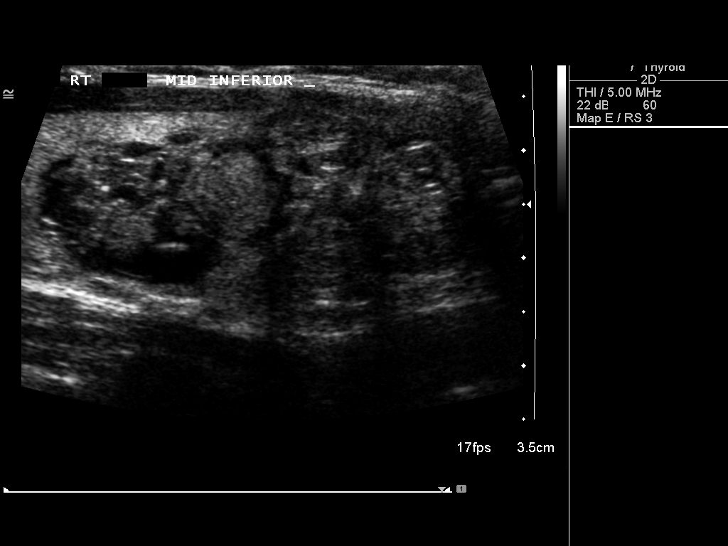
[im 28/52]
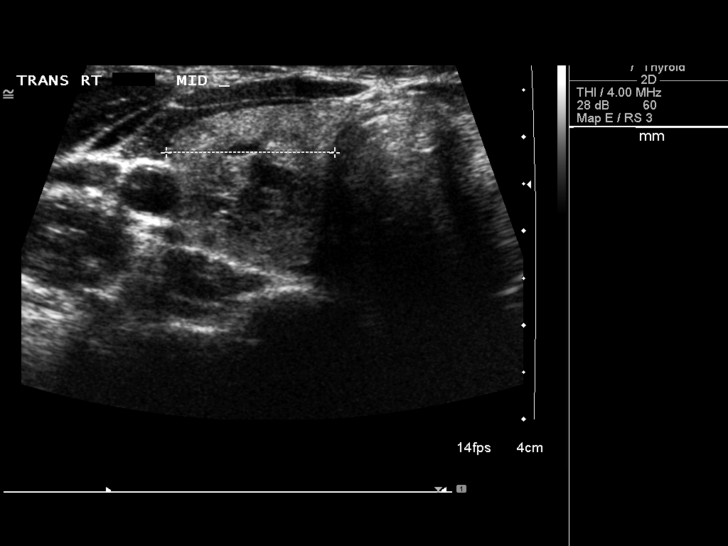
[im 32/52]
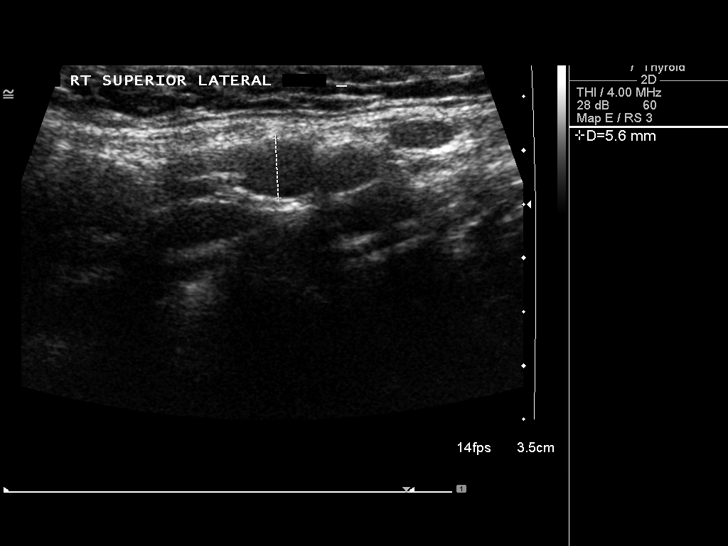
[im 35/52]
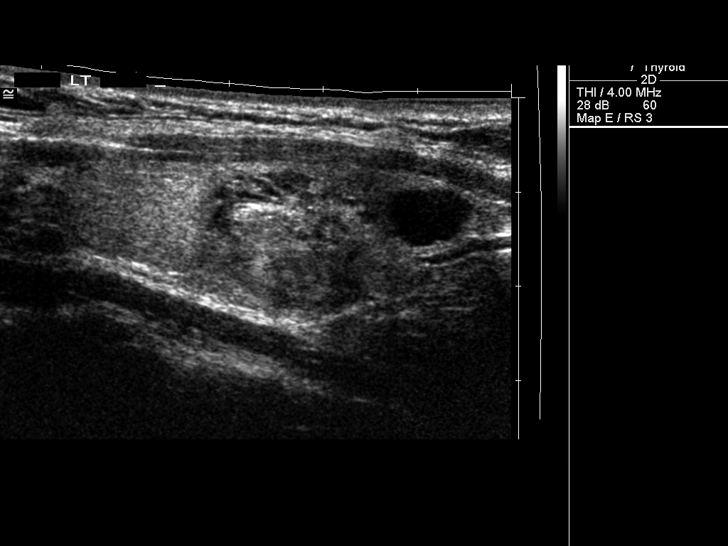
[im 39/52]
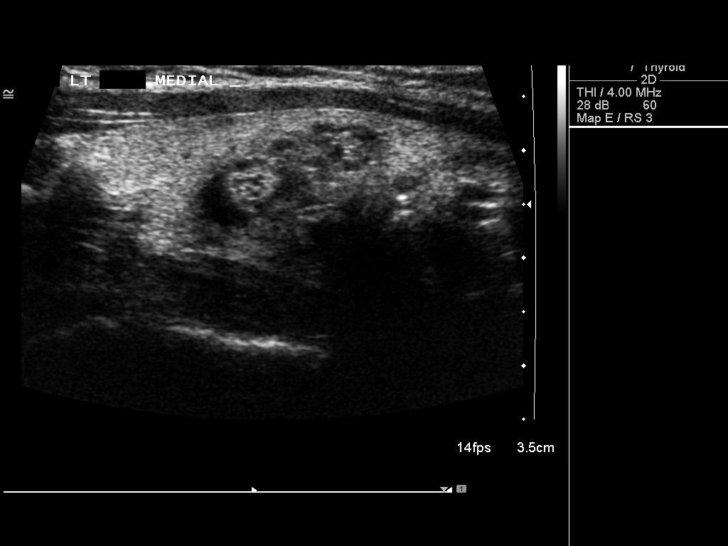
[im 43/52]
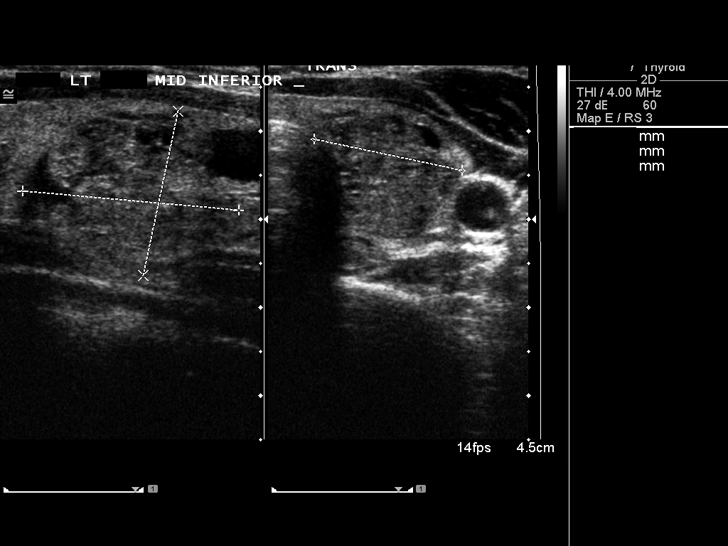
[im 47/52]
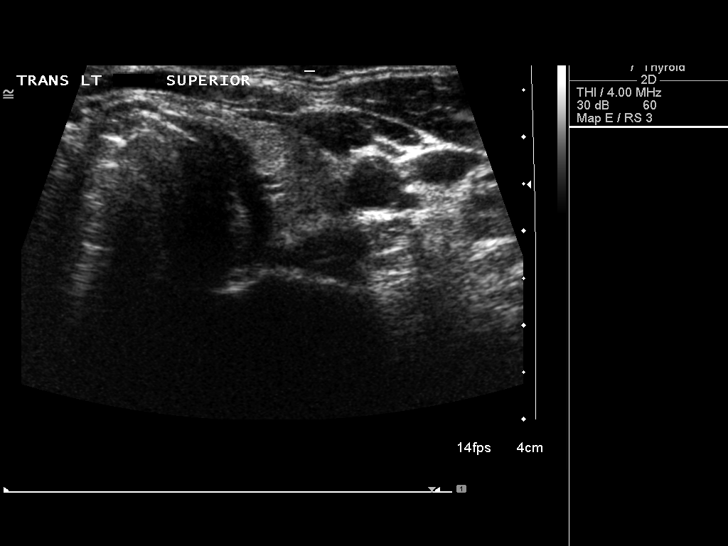
[im 52/52]
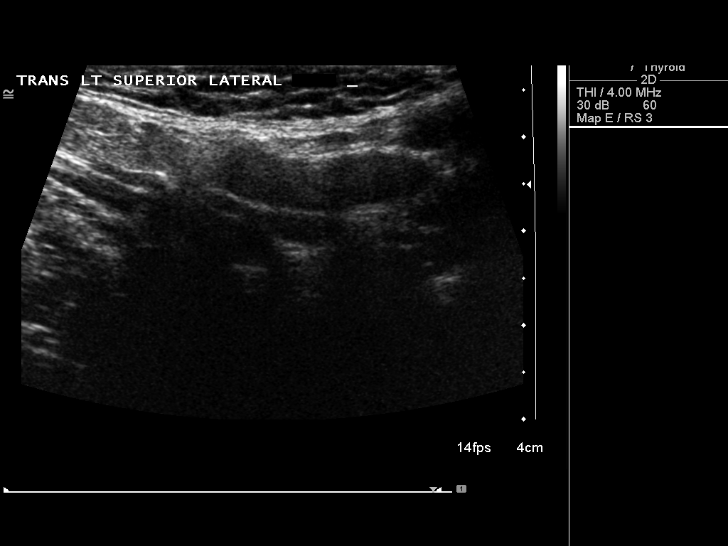

[14 of 25 positions shown; findings below may reference images not displayed]

FINDINGS: Right thyroid lobe:  6.4 x 2.1 x 1.8 cm
Left thyroid lobe:  4.8 x 1.8 x 1.7 cm
Isthmus:  0.3 cm

Focal nodules:  Dominant partially solid and partially cystic
nodule in the inferior aspect of the right lobe shows similar
morphology and size since the prior study in 8202 with current
maximal dimensions of approximately 4.3 x 2.0 x 1.9 cm.  Previous
maximal length was 4.6 cm.

Dominant solid and cystic nodule of the left lobe measures
approximately 2.5 x 1.9 x 1.7 cm.  This nodule is stable.  Other
small cystic and partially cystic nodules bilaterally are stable
and measure under 1 cm.

Lymphadenopathy:  None visualized.
IMPRESSION: Stable multinodular thyroid goiter with stable dominant nodules
identified bilaterally.

## 2015-05-16 ENCOUNTER — Telehealth: Payer: Self-pay

## 2015-05-16 NOTE — Telephone Encounter (Signed)
DAUB - Pt has seen you formerly and requested this message be sent to you.  Her child was diagnosed by their pediatrician with strep today.  She also has a sore throat, but the pediatrician would not write her a script.   She wanted to know if you would just write her a script also.  I told her she would probably have to be seen.  She has not been here since 2014.  She still wanted me to ask.  [phone]

## 2015-05-17 NOTE — Telephone Encounter (Signed)
She would have to come in for a strep test and be seen.

## 2015-05-18 NOTE — Telephone Encounter (Signed)
Called pt pt, left message on voicemail to come in.

## 2015-07-22 ENCOUNTER — Ambulatory Visit (INDEPENDENT_AMBULATORY_CARE_PROVIDER_SITE_OTHER): Payer: 59 | Admitting: Physician Assistant

## 2015-07-22 VITALS — BP 112/80 | HR 97 | Temp 98.4°F | Resp 16 | Ht 65.0 in | Wt 180.8 lb

## 2015-07-22 DIAGNOSIS — Z30432 Encounter for removal of intrauterine contraceptive device: Secondary | ICD-10-CM

## 2015-07-22 NOTE — Progress Notes (Signed)
   April Sanchez  MRN: [id number] DOB: [birth year]  Subjective:  Pt presents to clinic  Patient Active Problem List   Diagnosis Date Noted  . Goiter, nontoxic, multinodular 05/27/2013    Current Outpatient Prescriptions on File Prior to Visit  Medication Sig Dispense Refill  . LORazepam (ATIVAN) 0.5 MG tablet Take one tablet daily as needed for stress (Patient not taking: Reported on 07/22/2015) 30 tablet 1  . mupirocin ointment (BACTROBAN) 2 % Apply topically 2 (two) times daily. (Patient not taking: Reported on 07/22/2015) 22 g 0   No current facility-administered medications on file prior to visit.    Allergies  Allergen Reactions  . Hydrocodone Nausea Only    Review of Systems  Constitutional: Negative for fever and chills.  Genitourinary: Positive for menstrual problem (increase cramping).   Objective:  BP 112/80 mmHg  Pulse 97  Temp(Src) 98.4 F (36.9 C) (Oral)  Resp 16  Ht  (1.651 m)  Wt 180 lb 12.8 oz (82.01 kg)  BMI 30.09 kg/m2  SpO2 99%  LMP 06/27/2015  Physical Exam  Constitutional: She is oriented to person, place, and time and well-developed, well-nourished, and in no distress.  HENT:  Head: Normocephalic and atraumatic.  Right Ear: Hearing and external ear normal.  Left Ear: Hearing and external ear normal.  Eyes: Conjunctivae are normal.  Neck: Normal range of motion.  Pulmonary/Chest: Effort normal.  Genitourinary: Uterus normal and cervix normal.  Initially IUD string is not visible, there is copiuous mucus at the os that is easily removed - cotton rotated inside the internal os and the string was removed from the os.  The IUD was then removed without resistance.  Neurological: She is alert and oriented to person, place, and time. Gait normal.  Skin: Skin is warm and dry.  Psychiatric: Mood, memory, affect and judgment normal.  Vitals reviewed.   Assessment and Plan :  Encounter for IUD removal   Counseling patient on the next step -  she will allow herself to have 1 menstrual cycle before she actively tries to get pregnant.  Benny Lennert PA-C  [facility] 07/22/2015 3:24 PM

## 2015-07-22 NOTE — Progress Notes (Signed)
   Subjective:    Patient ID: April Sanchez Age, female    DOB: [birth year], 29 y.o.   MRN: [id number]  HPI Pt presents for removal of her ParaGard IUD. She has had the IUD x 6 years. She reports ADRs: hormonal acne and heavy periods. She and her husband are ready to conceive again. No recent STIs or vaginal infections. Denies vaginal discharge, dysuria, hematuria. Pt took motrin prior to coming to the office in anticipation of the removal.  Review of Systems As above.    Objective:   Physical Exam  Genitourinary: Vagina normal and uterus normal. No vaginal discharge found.  IUD removal: speculum was placed cervix was visualized. Healthy multiparous cervix with thin white cervical discharge. Strings were visualized and IUD removed with no resistance.       Assessment & Plan:  Encounter for IUD removal -start prenatal vitamin, wait one menstrual cycle prior to trying to conceive.

## 2015-12-05 LAB — OB RESULTS CONSOLE GC/CHLAMYDIA
CHLAMYDIA, DNA PROBE: NEGATIVE
GC PROBE AMP, GENITAL: NEGATIVE

## 2015-12-05 LAB — OB RESULTS CONSOLE ANTIBODY SCREEN: ANTIBODY SCREEN: NEGATIVE

## 2015-12-05 LAB — OB RESULTS CONSOLE HEPATITIS B SURFACE ANTIGEN: HEP B S AG: NEGATIVE

## 2015-12-05 LAB — OB RESULTS CONSOLE ABO/RH: RH Type: POSITIVE

## 2015-12-05 LAB — OB RESULTS CONSOLE RUBELLA ANTIBODY, IGM: RUBELLA: IMMUNE

## 2015-12-05 LAB — OB RESULTS CONSOLE HIV ANTIBODY (ROUTINE TESTING): HIV: NONREACTIVE

## 2015-12-05 LAB — OB RESULTS CONSOLE RPR: RPR: NONREACTIVE

## 2016-02-14 ENCOUNTER — Observation Stay (HOSPITAL_COMMUNITY)
Admission: AD | Admit: 2016-02-14 | Discharge: 2016-02-15 | Disposition: A | Discharge status: Home / Self Care | Payer: Medicaid Other | Source: Ambulatory Visit | Attending: Obstetrics and Gynecology | Admitting: Obstetrics and Gynecology

## 2016-02-14 ENCOUNTER — Encounter (HOSPITAL_COMMUNITY): Payer: Self-pay | Admitting: *Deleted

## 2016-02-14 DIAGNOSIS — R1032 Left lower quadrant pain: Secondary | ICD-10-CM | POA: Diagnosis present

## 2016-02-14 DIAGNOSIS — Z3A21 21 weeks gestation of pregnancy: Secondary | ICD-10-CM | POA: Diagnosis not present

## 2016-02-14 DIAGNOSIS — O26892 Other specified pregnancy related conditions, second trimester: Secondary | ICD-10-CM | POA: Diagnosis not present

## 2016-02-14 DIAGNOSIS — Z889 Allergy status to unspecified drugs, medicaments and biological substances status: Secondary | ICD-10-CM

## 2016-02-14 DIAGNOSIS — Z87891 Personal history of nicotine dependence: Secondary | ICD-10-CM | POA: Diagnosis not present

## 2016-02-14 DIAGNOSIS — F419 Anxiety disorder, unspecified: Secondary | ICD-10-CM | POA: Diagnosis not present

## 2016-02-14 LAB — CBC WITH DIFFERENTIAL/PLATELET
Basophils Absolute: 0 10*3/uL (ref 0.0–0.1)
Basophils Absolute: 0 10*3/uL (ref 0.0–0.1)
Basophils Relative: 0 %
Basophils Relative: 0 %
EOS ABS: 0 10*3/uL (ref 0.0–0.7)
Eosinophils Absolute: 0.1 10*3/uL (ref 0.0–0.7)
Eosinophils Relative: 0 %
Eosinophils Relative: 1 %
HCT: 35.2 % — ABNORMAL LOW (ref 36.0–46.0)
HCT: 33.5 % — ABNORMAL LOW (ref 36.0–46.0)
HEMOGLOBIN: 12.1 g/dL (ref 12.0–15.0)
HEMOGLOBIN: 11.4 g/dL — AB (ref 12.0–15.0)
LYMPHS ABS: 1.6 10*3/uL (ref 0.7–4.0)
LYMPHS ABS: 2.1 10*3/uL (ref 0.7–4.0)
LYMPHS PCT: 14 %
Lymphocytes Relative: 11 %
MCH: 30.4 pg (ref 26.0–34.0)
MCH: 30.5 pg (ref 26.0–34.0)
MCHC: 34.4 g/dL (ref 30.0–36.0)
MCHC: 34 g/dL (ref 30.0–36.0)
MCV: 88.4 fL (ref 78.0–100.0)
MCV: 89.6 fL (ref 78.0–100.0)
Monocytes Absolute: 0.4 10*3/uL (ref 0.1–1.0)
Monocytes Absolute: 0.4 10*3/uL (ref 0.1–1.0)
Monocytes Relative: 3 %
Monocytes Relative: 2 %
NEUTROS ABS: 12.6 10*3/uL — AB (ref 1.7–7.7)
NEUTROS PCT: 86 %
NEUTROS PCT: 83 %
Neutro Abs: 12.5 10*3/uL — ABNORMAL HIGH (ref 1.7–7.7)
Platelets: 229 10*3/uL (ref 150–400)
Platelets: 214 10*3/uL (ref 150–400)
RBC: 3.98 MIL/uL (ref 3.87–5.11)
RBC: 3.74 MIL/uL — AB (ref 3.87–5.11)
RDW: 13 % (ref 11.5–15.5)
RDW: 13.1 % (ref 11.5–15.5)
WBC: 14.5 10*3/uL — AB (ref 4.0–10.5)
WBC: 15.2 10*3/uL — AB (ref 4.0–10.5)

## 2016-02-14 LAB — COMPREHENSIVE METABOLIC PANEL
ALBUMIN: 3.2 g/dL — AB (ref 3.5–5.0)
ALK PHOS: 46 U/L (ref 38–126)
ALT: 11 U/L — AB (ref 14–54)
AST: 19 U/L (ref 15–41)
Anion gap: 9 (ref 5–15)
BUN: 9 mg/dL (ref 6–20)
CALCIUM: 8.5 mg/dL — AB (ref 8.9–10.3)
CO2: 20 mmol/L — AB (ref 22–32)
CREATININE: 0.41 mg/dL — AB (ref 0.44–1.00)
Chloride: 105 mmol/L (ref 101–111)
GFR calc non Af Amer: >60 mL/min (ref >60)
GLUCOSE: 85 mg/dL (ref 65–99)
Potassium: 3.8 mmol/L (ref 3.5–5.1)
SODIUM: 134 mmol/L — AB (ref 135–145)
Total Bilirubin: 0.7 mg/dL (ref 0.3–1.2)
Total Protein: 6.4 g/dL — ABNORMAL LOW (ref 6.5–8.1)

## 2016-02-14 LAB — URINE MICROSCOPIC-ADD ON
RBC / HPF: NONE SEEN RBC/hpf (ref 0–5)
WBC UA: NONE SEEN WBC/hpf (ref 0–5)

## 2016-02-14 LAB — WET PREP, GENITAL
Clue Cells Wet Prep HPF POC: NONE SEEN
Sperm: NONE SEEN
Trich, Wet Prep: NONE SEEN
Yeast Wet Prep HPF POC: NONE SEEN

## 2016-02-14 LAB — URINALYSIS, ROUTINE W REFLEX MICROSCOPIC
Bilirubin Urine: NEGATIVE
Glucose, UA: NEGATIVE mg/dL
Hgb urine dipstick: NEGATIVE
Ketones, ur: 40 mg/dL — AB
Leukocytes, UA: NEGATIVE
Nitrite: NEGATIVE
Protein, ur: 30 mg/dL — AB
Specific Gravity, Urine: 1.015 (ref 1.005–1.030)
pH: >9 — ABNORMAL HIGH (ref 5.0–8.0)

## 2016-02-14 MED ORDER — HYDROMORPHONE HCL 1 MG/ML IJ SOLN
1.0000 mg | INTRAMUSCULAR | Status: AC
  Administered 2016-02-15: 1 mg via INTRAVENOUS

## 2016-02-14 MED ORDER — LACTATED RINGERS IV SOLN
INTRAVENOUS | Status: DC
  Administered 2016-02-14 – 2016-02-15 (×3): via INTRAVENOUS

## 2016-02-14 MED ORDER — OXYCODONE-ACETAMINOPHEN 5-325 MG PO TABS
1.0000 | ORAL_TABLET | ORAL | Status: DC | PRN
  Administered 2016-02-14 – 2016-02-15 (×4): 1 via ORAL
  Filled 2016-02-14: qty 1
  Filled 2016-02-14: qty 2
  Filled 2016-02-14 (×2): qty 1

## 2016-02-14 MED ORDER — LACTATED RINGERS IV SOLN
INTRAVENOUS | Status: DC
  Administered 2016-02-14: 17:00:00 via INTRAVENOUS

## 2016-02-14 MED ORDER — HYDROMORPHONE HCL 1 MG/ML IJ SOLN
1.0000 mg | INTRAMUSCULAR | Status: DC | PRN
  Administered 2016-02-14 (×2): 1 mg via INTRAVENOUS
  Filled 2016-02-14 (×2): qty 1

## 2016-02-14 MED ORDER — ONDANSETRON HCL 4 MG PO TABS
4.0000 mg | ORAL_TABLET | Freq: Three times a day (TID) | ORAL | Status: DC | PRN
  Administered 2016-02-15 (×2): 4 mg via ORAL
  Filled 2016-02-14 (×2): qty 1

## 2016-02-14 MED ORDER — ACETAMINOPHEN 325 MG PO TABS
650.0000 mg | ORAL_TABLET | ORAL | Status: DC | PRN
  Administered 2016-02-15: 650 mg via ORAL
  Filled 2016-02-14: qty 2

## 2016-02-14 MED ORDER — LACTATED RINGERS IV BOLUS (SEPSIS)
500.0000 mL | Freq: Once | INTRAVENOUS | Status: AC
  Administered 2016-02-14: 1000 mL via INTRAVENOUS

## 2016-02-14 MED ORDER — DOCUSATE SODIUM 100 MG PO CAPS: 100.0000 mg | ORAL_CAPSULE | Freq: Every day | ORAL | Status: DC

## 2016-02-14 MED ORDER — ZOLPIDEM TARTRATE 5 MG PO TABS
5.0000 mg | ORAL_TABLET | Freq: Every evening | ORAL | Status: DC | PRN
  Administered 2016-02-14: 5 mg via ORAL
  Filled 2016-02-14: qty 1

## 2016-02-14 MED ORDER — CALCIUM CARBONATE ANTACID 500 MG PO CHEW: 2.0000 | CHEWABLE_TABLET | ORAL | Status: DC | PRN

## 2016-02-14 MED ORDER — PRENATAL MULTIVITAMIN CH
1.0000 | ORAL_TABLET | Freq: Every day | ORAL | Status: DC
  Administered 2016-02-15: 1 via ORAL
  Filled 2016-02-14: qty 1

## 2016-02-14 MED ORDER — HYDROMORPHONE HCL 1 MG/ML IJ SOLN
1.0000 mg | INTRAMUSCULAR | Status: DC | PRN
  Administered 2016-02-15 (×2): 1 mg via INTRAVENOUS
  Filled 2016-02-14 (×2): qty 1

## 2016-02-14 MED ORDER — DOCUSATE SODIUM 100 MG PO CAPS
100.0000 mg | ORAL_CAPSULE | Freq: Every day | ORAL | Status: DC
  Administered 2016-02-14: 100 mg via ORAL
  Filled 2016-02-14: qty 1

## 2016-02-14 MED ORDER — ONDANSETRON HCL 4 MG/2ML IJ SOLN
4.0000 mg | Freq: Once | INTRAMUSCULAR | Status: AC
  Administered 2016-02-14: 4 mg via INTRAVENOUS
  Filled 2016-02-14: qty 2

## 2016-02-14 NOTE — MAU Provider Note (Signed)
History   30 yo [id number] at 17 5/7 weeks presented after calling office with onset of LLQ pain, extending into left lower back--occurred upon awakening today around 11am, unresponsive to Tylenol, fluids, heat.  Denies contractions, leaking, bleeding, dysuria, discharge, fever, frequency, or any other issues.  Reports +FM.  Describes it "like spasms", with pain sharper upon movement.  Appears uncomfortable.  Shopped yesterday, but no hx of any heavy lifting.  Patient Active Problem List   Diagnosis Date Noted  . 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 Anxiety 02/14/2016  . Goiter, nontoxic, multinodular 05/27/2013    Chief Complaint  Patient presents with  . Back Pain   HPI:  See above  OB History    Gravida Para Term Preterm AB TAB SAB Ectopic Multiple Living   Past Medical History  Diagnosis Date  . Allergy   . Anxiety     Past Surgical History  Procedure Laterality Date  . No past surgeries      Family History  Problem Relation Age of Onset  . Arthritis Mother   . Cancer Mother     breast  . Kidney failure Mother   . Arthritis Father     Social History  Substance Use Topics  . Smoking status: Former Games developer  . Smokeless tobacco: None  . Alcohol Use: No    Allergies:  Allergies  Allergen Reactions  . Hydrocodone Nausea Only    Prescriptions prior to admission  Medication Sig Dispense Refill Last Dose  . acetaminophen (TYLENOL) 500 MG tablet Take 500 mg by mouth every 6 (six) hours as needed.   02/14/2016 at Unknown time  . Prenatal Vit-Fe Fumarate-FA (PRENATAL MULTIVITAMIN) TABS tablet Take 1 tablet by mouth daily at 12 noon.   02/13/2016 at Unknown time  . LORazepam (ATIVAN) 0.5 MG tablet Take one tablet daily as needed for stress (Patient not taking: Reported on 07/22/2015) 30 tablet 1 Not Taking  . mupirocin ointment (BACTROBAN) 2 % Apply topically 2 (two) times daily. (Patient not taking: Reported on 07/22/2015) 22 g 0 Not Taking     ROS:  LLQ and left lower back pain, +FM Physical Exam   Blood pressure 109/57, pulse 65, temperature 97.6 F (36.4 C), temperature source Oral, resp. rate 20, last menstrual period 06/27/2015, SpO2 100 %.  Filed Vitals:   02/14/16 1432  BP: 109/57  Pulse: 65  Temp: 97.6 F (36.4 C)  TempSrc: Oral  Resp: 20  SpO2: 100%     Physical Exam In mild distress with colicky LLQ and back pain Chest clear Heart RRR without murmur Abd--mild tenderness in LLQ/groin area, uterus soft/NT Back--negative CVAT Pelvic--cervix closed, long, firm, small amount thin white d/c Ext WNL  FHR 160 by doppler UCs none per palpation  Results for orders placed or performed during the hospital encounter of 02/14/16 (from the past 24 hour(s))  Urinalysis, Routine w reflex microscopic (not at [facility])     Status: Abnormal   Collection Time: 02/14/16  2:20 PM  Result Value Ref Range   Color, Urine YELLOW YELLOW   APPearance CLEAR CLEAR   Specific Gravity, Urine 1.015 1.005 - 1.030   pH >9.0 (H) 5.0 - 8.0   Glucose, UA NEGATIVE NEGATIVE mg/dL   Hgb urine dipstick NEGATIVE NEGATIVE   Bilirubin Urine NEGATIVE NEGATIVE   Ketones, ur 40 (A) NEGATIVE mg/dL   Protein, ur 30 (A) NEGATIVE mg/dL   Nitrite  NEGATIVE NEGATIVE   Leukocytes, UA NEGATIVE NEGATIVE  Urine microscopic-add on     Status: Abnormal   Collection Time: 02/14/16  2:20 PM  Result Value Ref Range   Squamous Epithelial / LPF 6-30 (A) NONE SEEN   WBC, UA NONE SEEN 0 - 5 WBC/hpf   RBC / HPF NONE SEEN 0 - 5 RBC/hpf   Bacteria, UA MANY (A) NONE SEEN   Urine-Other MUCOUS PRESENT   Wet prep, genital     Status: Abnormal   Collection Time: 02/14/16  3:08 PM  Result Value Ref Range   Yeast Wet Prep HPF POC NONE SEEN NONE SEEN   Trich, Wet Prep NONE SEEN NONE SEEN   Clue Cells Wet Prep HPF POC NONE SEEN NONE SEEN   WBC, Wet Prep HPF POC MANY (A) NONE SEEN   Sperm NONE SEEN   CBC with Differential/Platelet     Status: Abnormal   Collection  Time: 02/14/16  3:25 PM  Result Value Ref Range   WBC 14.5 (H) 4.0 - 10.5 K/uL   RBC 3.98 3.87 - 5.11 MIL/uL   Hemoglobin 12.1 12.0 - 15.0 g/dL   HCT 42.7 (L) 06.2 - 37.6 %   MCV 88.4 78.0 - 100.0 fL   MCH 30.4 26.0 - 34.0 pg   MCHC 34.4 30.0 - 36.0 g/dL   RDW 28.3 15.1 - 76.1 %   Platelets 229 150 - 400 K/uL   Neutrophils Relative % 86 %   Neutro Abs 12.5 (H) 1.7 - 7.7 K/uL   Lymphocytes Relative 11 %   Lymphs Abs 1.6 0.7 - 4.0 K/uL   Monocytes Relative 3 %   Monocytes Absolute 0.4 0.1 - 1.0 K/uL   Eosinophils Relative 0 %   Eosinophils Absolute 0.0 0.0 - 0.7 K/uL   Basophils Relative 0 %   Basophils Absolute 0.0 0.0 - 0.1 K/uL     ED Course  Assessment: IUP at 21 5/7 weeks ? RLP/Muscle spasm ? UTI Leukocytosis  Plan: Urine to culture CMP pending IV hydration Dilaudid Wet prep Consultation with Dr. Normand Sloop.    Nigel Bridgeman CNM, MSN 02/14/2016 3:50 PM   Addendum: Received IV Dilaudid 1 mg at 1524 with relief, but pain now has resumed. Repeat dose of same at 1719.  CMP Latest Ref Rng 02/14/2016 05/27/2013  Glucose 65 - 99 mg/dL 85 90  BUN 6 - 20 mg/dL 9 14  Creatinine 6.07 - 1.00 mg/dL 3.71(G) 6.26  Sodium 948 - 145 mmol/L 134(L) 139  Potassium 3.5 - 5.1 mmol/L 3.8 4.3  Chloride 101 - 111 mmol/L 105 104  CO2 22 - 32 mmol/L 20(L) 22  Calcium 8.9 - 10.3 mg/dL 5.4(O) 9.5  Total Protein 6.5 - 8.1 g/dL 6.4(L) 7.6  Total Bilirubin 0.3 - 1.2 mg/dL 0.7 0.4  Alkaline Phos 38 - 126 U/L 46 33(L)  AST 15 - 41 U/L 19 13  ALT 14 - 54 U/L 11(L) 9   Consulted with Dr. Normand Sloop. Will admit to 3rd floor for overnight observation, IV hydration, Percocet for moderate pain, Dilaudid for break-through pain. FHR q shift. Repeat CBC/diff and UA in am. Will await urine culture result.  Nigel Bridgeman, CNM 02/14/16 5:53p

## 2016-02-14 NOTE — MAU Note (Addendum)
Pt states left sided lower back pain which radiates around to left groin area and down the leg.  Progressively has gotten worse.  Good fetal movement.  Denies ROM or vaginal bleeding.  Pt took one extra strength tylenol about 1150 without relief.

## 2016-02-15 LAB — URINALYSIS, ROUTINE W REFLEX MICROSCOPIC
Bilirubin Urine: NEGATIVE
GLUCOSE, UA: NEGATIVE mg/dL
HGB URINE DIPSTICK: NEGATIVE
KETONES UR: NEGATIVE mg/dL
Leukocytes, UA: NEGATIVE
Nitrite: NEGATIVE
PROTEIN: NEGATIVE mg/dL
Specific Gravity, Urine: <1.005 — ABNORMAL LOW (ref 1.005–1.030)
pH: 6 (ref 5.0–8.0)

## 2016-02-15 LAB — CBC
HEMATOCRIT: 31.1 % — AB (ref 36.0–46.0)
Hemoglobin: 10.3 g/dL — ABNORMAL LOW (ref 12.0–15.0)
MCH: 30.7 pg (ref 26.0–34.0)
MCHC: 33.1 g/dL (ref 30.0–36.0)
MCV: 92.6 fL (ref 78.0–100.0)
Platelets: 175 10*3/uL (ref 150–400)
RBC: 3.36 MIL/uL — ABNORMAL LOW (ref 3.87–5.11)
RDW: 13.3 % (ref 11.5–15.5)
WBC: 10.1 10*3/uL (ref 4.0–10.5)

## 2016-02-15 LAB — URINE CULTURE

## 2016-02-15 MED ORDER — HYDROMORPHONE HCL 2 MG PO TABS: 1.0000 mg | ORAL_TABLET | Freq: Four times a day (QID) | ORAL | Status: AC | PRN

## 2016-02-15 MED ORDER — HYDROMORPHONE HCL 2 MG PO TABS
1.0000 mg | ORAL_TABLET | Freq: Once | ORAL | Status: AC
  Administered 2016-02-15: 1 mg via ORAL
  Filled 2016-02-15: qty 1

## 2016-02-15 MED ORDER — PROMETHAZINE HCL 12.5 MG PO TABS: 12.5000 mg | ORAL_TABLET | Freq: Four times a day (QID) | ORAL | Status: AC | PRN

## 2016-02-15 NOTE — H&P (Signed)
30 yo [id number] at 21 5/7 weeks presented after calling office with onset of LLQ pain, extending into left lower back--occurred upon awakening today around 11am, unresponsive to Tylenol, fluids, heat. Denies contractions, leaking, bleeding, dysuria, discharge, fever, frequency, or any other issues. Reports +FM. Describes it "like spasms", with pain sharper upon movement. Appeared uncomfortable. Shopped yesterday, but no hx of any heavy lifting.  Patient Active Problem List   Diagnosis Date Noted  . 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 Anxiety 02/14/2016  . Goiter, nontoxic, multinodular 05/27/2013    Chief Complaint  Patient presents with  . Back Pain   HPI: See above  OB History    Gravida Para Term Preterm AB TAB SAB Ectopic Multiple Living   Past Medical History  Diagnosis Date  . Allergy   . Anxiety     Past Surgical History  Procedure Laterality Date  . No past surgeries      Family History  Problem Relation Age of Onset  . Arthritis Mother   . Cancer Mother     breast  . Kidney failure Mother   . Arthritis Father     Social History  Substance Use Topics  . Smoking status: Former Games developer  . Smokeless tobacco: None  . Alcohol Use: No    Allergies:  Allergies  Allergen Reactions  . Hydrocodone Nausea Only    Prescriptions prior to admission  Medication Sig Dispense Refill Last Dose  . acetaminophen (TYLENOL) 500 MG tablet Take 500 mg by mouth every 6 (six) hours as needed.   02/14/2016 at Unknown time  . Prenatal Vit-Fe Fumarate-FA (PRENATAL MULTIVITAMIN) TABS tablet Take 1 tablet by mouth daily at 12 noon.   02/13/2016 at Unknown time  . LORazepam (ATIVAN) 0.5 MG tablet Take one tablet daily as needed for stress (Patient not taking: Reported on 07/22/2015) 30 tablet 1 Not Taking  .  mupirocin ointment (BACTROBAN) 2 % Apply topically 2 (two) times daily. (Patient not taking: Reported on 07/22/2015) 22 g 0 Not Taking    ROS: LLQ and left lower back pain, +FM Physical Exam   Blood pressure 109/57, pulse 65, temperature 97.6 F (36.4 C), temperature source Oral, resp. rate 20, last menstrual period 06/27/2015, SpO2 100 %.  Filed Vitals:   02/14/16 1432  BP: 109/57  Pulse: 65  Temp: 97.6 F (36.4 C)  TempSrc: Oral  Resp: 20  SpO2: 100%     Physical Exam In mild distress with colicky LLQ and back pain Chest clear Heart RRR without murmur Abd--mild tenderness in LLQ/groin area, uterus soft/NT Back--negative CVAT Pelvic--cervix closed, long, firm, small amount thin white d/c Ext WNL  FHR 160 by doppler UCs none per palpation   Lab Results Last 24 Hours    Results for orders placed or performed during the hospital encounter of 02/14/16 (from the past 24 hour(s))  Urinalysis, Routine w reflex microscopic (not at [facility]) Status: Abnormal   Collection Time: 02/14/16 2:20 PM  Result Value Ref Range   Color, Urine YELLOW YELLOW   APPearance CLEAR CLEAR   Specific Gravity, Urine 1.015 1.005 - 1.030   pH >9.0 (H) 5.0 - 8.0   Glucose, UA NEGATIVE NEGATIVE mg/dL   Hgb urine dipstick NEGATIVE NEGATIVE   Bilirubin Urine NEGATIVE NEGATIVE   Ketones, ur 40 (A) NEGATIVE mg/dL   Protein, ur 30 (A) NEGATIVE mg/dL   Nitrite NEGATIVE NEGATIVE   Leukocytes, UA  NEGATIVE NEGATIVE  Urine microscopic-add on Status: Abnormal   Collection Time: 02/14/16 2:20 PM  Result Value Ref Range   Squamous Epithelial / LPF 6-30 (A) NONE SEEN   WBC, UA NONE SEEN 0 - 5 WBC/hpf   RBC / HPF NONE SEEN 0 - 5 RBC/hpf   Bacteria, UA MANY (A) NONE SEEN   Urine-Other MUCOUS PRESENT   Wet prep, genital Status: Abnormal   Collection Time: 02/14/16 3:08 PM  Result Value Ref  Range   Yeast Wet Prep HPF POC NONE SEEN NONE SEEN   Trich, Wet Prep NONE SEEN NONE SEEN   Clue Cells Wet Prep HPF POC NONE SEEN NONE SEEN   WBC, Wet Prep HPF POC MANY (A) NONE SEEN   Sperm NONE SEEN   CBC with Differential/Platelet Status: Abnormal   Collection Time: 02/14/16 3:25 PM  Result Value Ref Range   WBC 14.5 (H) 4.0 - 10.5 K/uL   RBC 3.98 3.87 - 5.11 MIL/uL   Hemoglobin 12.1 12.0 - 15.0 g/dL   HCT 40.9 (L) 81.1 - 91.4 %   MCV 88.4 78.0 - 100.0 fL   MCH 30.4 26.0 - 34.0 pg   MCHC 34.4 30.0 - 36.0 g/dL   RDW 78.2 95.6 - 21.3 %   Platelets 229 150 - 400 K/uL   Neutrophils Relative % 86 %   Neutro Abs 12.5 (H) 1.7 - 7.7 K/uL   Lymphocytes Relative 11 %   Lymphs Abs 1.6 0.7 - 4.0 K/uL   Monocytes Relative 3 %   Monocytes Absolute 0.4 0.1 - 1.0 K/uL   Eosinophils Relative 0 %   Eosinophils Absolute 0.0 0.0 - 0.7 K/uL   Basophils Relative 0 %   Basophils Absolute 0.0 0.0 - 0.1 K/uL       ED Course  Assessment: IUP at 21 5/7 weeks ? RLP/Muscle spasm ? UTI Leukocytosis  Plan: Urine to culture CMP pending IV hydration Dilaudid Wet prep Consultation with Dr. Normand Sloop.    Nigel Bridgeman CNM, MSN 02/14/2016 3:50 PM   Addendum: Received IV Dilaudid 1 mg at 1524 with relief, but pain now has resumed. Repeat dose of same at 1719.  CMP Latest Ref Rng 02/14/2016 05/27/2013  Glucose 65 - 99 mg/dL 85 90  BUN 6 - 20 mg/dL 9 14  Creatinine 0.86 - 1.00 mg/dL 5.78(I) 6.96  Sodium 295 - 145 mmol/L 134(L) 139  Potassium 3.5 - 5.1 mmol/L 3.8 4.3  Chloride 101 - 111 mmol/L 105 104  CO2 22 - 32 mmol/L 20(L) 22  Calcium 8.9 - 10.3 mg/dL 2.8(U) 9.5  Total Protein 6.5 - 8.1 g/dL 6.4(L) 7.6  Total Bilirubin 0.3 - 1.2 mg/dL 0.7 0.4  Alkaline Phos 38 - 126 U/L 46 33(L)  AST 15 - 41 U/L 19 13  ALT 14 - 54 U/L 11(L)  9   Consulted with Dr. Normand Sloop. Will admit to 3rd floor for overnight observation, IV hydration, Percocet for moderate pain, Dilaudid for break-through pain. FHR q shift. Repeat CBC/diff and UA in am. Will await urine culture result.  Nigel Bridgeman, CNM 02/14/16 5:53p

## 2016-02-15 NOTE — Progress Notes (Signed)
Patient discharged, ambulatory to car.  Discharged instructions reviewed by Deberah Castle, RN , questions offered and answered. Discharged at [facility]

## 2016-02-15 NOTE — Discharge Summary (Signed)
Physician Discharge Summary  Patient ID: DIVYA MUNSHI MRN: [id number] DOB/AGE: [birth year] 31 y.o.  Admit date: 02/14/2016 Discharge date: 02/15/2016  Admission Diagnoses: LLQ PAIN  Discharge Diagnoses:  Active Problems:   Drug allergy--hydrocodone, N/V   Anxiety   LLQ pain   Discharged Condition: stable  Hospital Course: Pt was admitted for LLQ pain  .  Urine culture was contaminated.  She did get relief with dilaudid and IVF.  Repeat urine culture sent.  She will go home with hydration and pain meds and FU in one week  Consults: None  Significant Diagnostic Studies: see labs  Treatments: IV hydration  Discharge Exam: Blood pressure 89/52, pulse 70, temperature 98 F (36.7 C), temperature source Oral, resp. rate 18, height  (1.651 m), weight 195 lb 8 oz (88.678 kg), last menstrual period 06/27/2015, SpO2 100 %. General appearance: alert and cooperative Neck: thyroid not enlarged, symmetric, no tenderness/mass/nodules Back: symmetric, no curvature. ROM normal. No CVA tenderness. GI: soft, non-tender; bowel sounds normal; no masses,  no organomegaly Pelvic: no cervical motion tenderness and cervix long and closed Extremities: extremities normal, atraumatic, no cyanosis or edema  Disposition:      Medication List    STOP taking these medications        acetaminophen 500 MG tablet  Commonly known as:  TYLENOL     LORazepam 0.5 MG tablet  Commonly known as:  ATIVAN      TAKE these medications        HYDROmorphone 2 MG tablet  Commonly known as:  DILAUDID  Take 0.5 tablets (1 mg total) by mouth every 6 (six) hours as needed for severe pain.     mupirocin ointment 2 %  Commonly known as:  BACTROBAN  Apply topically 2 (two) times daily.     prenatal multivitamin Tabs tablet  Take 1 tablet by mouth daily at 12 noon.     promethazine 12.5 MG tablet  Commonly known as:  PHENERGAN  Take 1 tablet (12.5 mg total) by mouth every 6 (six) hours as needed for  nausea or vomiting.           Follow-up Information    Follow up with [facility] On 02/22/2016.   Specialty:  Radiology   Why:  at 4:30 with Dr Domenick Bookbinder information:   [address] [place] [address] [phone]      Signed: Jaymes Graff A 02/15/2016, 5:00 PM

## 2016-02-17 LAB — URINE CULTURE

## 2016-05-24 LAB — OB RESULTS CONSOLE GBS: GBS: POSITIVE

## 2016-06-21 ENCOUNTER — Inpatient Hospital Stay (HOSPITAL_COMMUNITY): Payer: Medicaid Other

## 2016-06-21 ENCOUNTER — Inpatient Hospital Stay (HOSPITAL_COMMUNITY)
Admission: AD | Admit: 2016-06-21 | Discharge: 2016-06-25 | DRG: 766 | Disposition: A | Discharge status: Home / Self Care | Payer: Medicaid Other | Source: Ambulatory Visit | Attending: Obstetrics and Gynecology | Admitting: Obstetrics and Gynecology

## 2016-06-21 ENCOUNTER — Encounter (HOSPITAL_COMMUNITY): Payer: Self-pay | Admitting: *Deleted

## 2016-06-21 DIAGNOSIS — N838 Other noninflammatory disorders of ovary, fallopian tube and broad ligament: Secondary | ICD-10-CM | POA: Diagnosis present

## 2016-06-21 DIAGNOSIS — O4103X0 Oligohydramnios, third trimester, not applicable or unspecified: Principal | ICD-10-CM | POA: Diagnosis present

## 2016-06-21 DIAGNOSIS — O99344 Other mental disorders complicating childbirth: Secondary | ICD-10-CM | POA: Diagnosis present

## 2016-06-21 DIAGNOSIS — F419 Anxiety disorder, unspecified: Secondary | ICD-10-CM

## 2016-06-21 DIAGNOSIS — O99284 Endocrine, nutritional and metabolic diseases complicating childbirth: Secondary | ICD-10-CM | POA: Diagnosis present

## 2016-06-21 DIAGNOSIS — E042 Nontoxic multinodular goiter: Secondary | ICD-10-CM | POA: Diagnosis present

## 2016-06-21 DIAGNOSIS — Z3A40 40 weeks gestation of pregnancy: Secondary | ICD-10-CM | POA: Diagnosis not present

## 2016-06-21 DIAGNOSIS — O368190 Decreased fetal movements, unspecified trimester, not applicable or unspecified: Secondary | ICD-10-CM

## 2016-06-21 DIAGNOSIS — O3483 Maternal care for other abnormalities of pelvic organs, third trimester: Secondary | ICD-10-CM | POA: Diagnosis present

## 2016-06-21 DIAGNOSIS — Z841 Family history of disorders of kidney and ureter: Secondary | ICD-10-CM

## 2016-06-21 DIAGNOSIS — O368130 Decreased fetal movements, third trimester, not applicable or unspecified: Secondary | ICD-10-CM | POA: Diagnosis present

## 2016-06-21 DIAGNOSIS — Z87891 Personal history of nicotine dependence: Secondary | ICD-10-CM | POA: Diagnosis not present

## 2016-06-21 DIAGNOSIS — F418 Other specified anxiety disorders: Secondary | ICD-10-CM | POA: Diagnosis present

## 2016-06-21 DIAGNOSIS — K219 Gastro-esophageal reflux disease without esophagitis: Secondary | ICD-10-CM | POA: Diagnosis present

## 2016-06-21 DIAGNOSIS — Z8659 Personal history of other mental and behavioral disorders: Secondary | ICD-10-CM

## 2016-06-21 DIAGNOSIS — O288 Other abnormal findings on antenatal screening of mother: Secondary | ICD-10-CM | POA: Diagnosis present

## 2016-06-21 DIAGNOSIS — O9962 Diseases of the digestive system complicating childbirth: Secondary | ICD-10-CM | POA: Diagnosis present

## 2016-06-21 DIAGNOSIS — O99824 Streptococcus B carrier state complicating childbirth: Secondary | ICD-10-CM | POA: Diagnosis present

## 2016-06-21 DIAGNOSIS — O99820 Streptococcus B carrier state complicating pregnancy: Secondary | ICD-10-CM

## 2016-06-21 DIAGNOSIS — Z98891 History of uterine scar from previous surgery: Secondary | ICD-10-CM

## 2016-06-21 LAB — CBC
HEMATOCRIT: 38.3 % (ref 36.0–46.0)
HEMOGLOBIN: 12.8 g/dL (ref 12.0–15.0)
MCH: 29.6 pg (ref 26.0–34.0)
MCHC: 33.4 g/dL (ref 30.0–36.0)
MCV: 88.5 fL (ref 78.0–100.0)
Platelets: 249 10*3/uL (ref 150–400)
RBC: 4.33 MIL/uL (ref 3.87–5.11)
RDW: 13.8 % (ref 11.5–15.5)
WBC: 12.7 10*3/uL — ABNORMAL HIGH (ref 4.0–10.5)

## 2016-06-21 LAB — TYPE AND SCREEN
ABO/RH(D): O POS
ANTIBODY SCREEN: NEGATIVE

## 2016-06-21 MED ORDER — FLEET ENEMA 7-19 GM/118ML RE ENEM: 1.0000 | ENEMA | RECTAL | Status: DC | PRN

## 2016-06-21 MED ORDER — LACTATED RINGERS IV SOLN: 500.0000 mL | INTRAVENOUS | Status: DC | PRN

## 2016-06-21 MED ORDER — LACTATED RINGERS IV SOLN
INTRAVENOUS | Status: DC
  Administered 2016-06-21 – 2016-06-22 (×4): via INTRAVENOUS

## 2016-06-21 MED ORDER — ONDANSETRON HCL 4 MG/2ML IJ SOLN: 4.0000 mg | Freq: Four times a day (QID) | INTRAMUSCULAR | Status: DC | PRN

## 2016-06-21 MED ORDER — LIDOCAINE HCL (PF) 1 % IJ SOLN: 30.0000 mL | INTRAMUSCULAR | Status: DC | PRN

## 2016-06-21 MED ORDER — PENICILLIN G POTASSIUM 5000000 UNITS IJ SOLR
2.5000 10*6.[IU] | INTRAVENOUS | Status: DC
  Filled 2016-06-21: qty 2.5

## 2016-06-21 MED ORDER — LACTATED RINGERS IV BOLUS (SEPSIS)
500.0000 mL | Freq: Once | INTRAVENOUS | Status: AC
  Administered 2016-06-21: 500 mL via INTRAVENOUS

## 2016-06-21 MED ORDER — SOD CITRATE-CITRIC ACID 500-334 MG/5ML PO SOLN
30.0000 mL | ORAL | Status: DC | PRN
  Administered 2016-06-22: 30 mL via ORAL
  Filled 2016-06-21: qty 15

## 2016-06-21 MED ORDER — OXYTOCIN 40 UNITS IN LACTATED RINGERS INFUSION - SIMPLE MED
2.5000 [IU]/h | INTRAVENOUS | Status: DC
  Filled 2016-06-21: qty 1000

## 2016-06-21 MED ORDER — ACETAMINOPHEN 325 MG PO TABS: 650.0000 mg | ORAL_TABLET | ORAL | Status: DC | PRN

## 2016-06-21 MED ORDER — LACTATED RINGERS IV SOLN
INTRAVENOUS | Status: DC
  Administered 2016-06-21: 22:00:00 via INTRAVENOUS

## 2016-06-21 MED ORDER — PENICILLIN G POTASSIUM 5000000 UNITS IJ SOLR
5.0000 10*6.[IU] | Freq: Once | INTRAVENOUS | Status: DC
  Filled 2016-06-21 (×2): qty 5

## 2016-06-21 MED ORDER — OXYTOCIN BOLUS FROM INFUSION: 500.0000 mL | INTRAVENOUS | Status: DC

## 2016-06-21 NOTE — H&P (Signed)
April Sanchez is a 30 y.o. female, [id number] at 40.0 weeks, presents for decreased FM since earlier today and irregular ctxs. Has complained of decreased FM several times throughout this pregnancy, with Cat 1 tracings noted each time. Denies LOF or VB. EDD 06/21/16 by sure LMP on 09/15/2015.   Sent for BPP due to variable decels on tracing. Notable for oligohydramnios (largest pocket 2.4 cm) -- possible 5 mm pleural effusion. BPP 8/8.    States no allergy, just had rxn to hydrocodone (Vomiting). Has taken med since this episode and did fine.  Prenatal course significant for:  1. Multinodular goiter. Evaluated by Dr. Cleta Albertsaub at Urgent Care. [facility] dx cyst Followed there, no issues.  2. Irregular heart beat. Evaluated by Dr. Cleta Albertsaub at Urgent Care. Records noted short PR interval and rate of 54 on EKG 09/2013, but chest pain "likely musculoskeletal in origin" --referred to Cardiology, Dr. Jacinto HalimGanji. Patient does not appear to have had that appt. No evidence of echo in records or in EPIC. Referred to Cardiology, patient elected to defer. No recent issues. 3. GBS positive - NKA to PCN.  4. History of anxiety & depression, symptoms well controlled without medication use.  5. Decreased FM on several occasions - Cat 1 tracings.    Patient Active Problem List   Diagnosis Date Noted  . AFI (amniotic fluid index) decreased 06/21/2016  . GBS (group B Streptococcus carrier), +RV culture, currently pregnant 06/21/2016  . History of depression 06/21/2016  . Anxiety 06/21/2016  . Goiter, nontoxic, multinodular 05/27/2013       OB History    Gravida Para Term Preterm AB TAB SAB Ectopic Multiple Living   4 3 3  1  1   0 1    SVB 12/25/2006, female infant SAB 12/2007 SVB 01/26/2009, female infant Past Medical History  Diagnosis Date  . Allergy   . Anxiety    Past Surgical History  Procedure Laterality Date  . No past surgeries     Family History: family history includes Arthritis in her  father and mother; Cancer in her mother; Kidney failure in her mother. Social History:  reports that she has quit smoking. She does not have any smokeless tobacco history on file. She reports that she does not drink alcohol or use illicit drugs.   Prenatal Transfer Tool  Maternal Diabetes: No Genetic Screening: Normal first trimester aneuploidy risk assessment Maternal Ultrasounds/Referrals: U/S in MAU due to variables on 06/21/16. HR 154 bpm, oligo at 2.4 cm, cephalic presentation, posterior placenta, cord insertion not well visualized. BPP 8/8   Fetal Ultrasounds or other Referrals: None  Maternal Substance Abuse:  No Significant Maternal Medications:  None Significant Maternal Lab Results: Lab values include: Group B Strep positive  TDAP: Declined Flu: Declined  ROS:10 Systems reviewed and are negative for acute change except as noted in the HPI.   Allergies  Allergen Reactions  . Hydrocodone Nausea Only     Dilation: 3 Effacement (%): 50 Station: -3 Exam by:: k Kathyjo Briere,cnm Blood pressure 102/73, pulse 55, temperature 97.9 F (36.6 C), temperature source Oral, resp. rate 18, height 5\' 5"  (1.651 m), weight 98.884 kg (218 lb), last menstrual period 06/27/2015, SpO2 99 %, unknown if currently breastfeeding.  Gen: Very anxious, asks numerous questions, some repetitive Chest clear Heart RRR without murmur Abd gravid, NT, FH CWD Pelvic: As above per RN Bishop score: 6  Adequate pelvis  Cephalic by Leopolds and VE Ext: Trace calf/pedal edema  FHR: BL  130 w/ moderate variability, +accels, mild- mod non persistent variables - intrauterine resuscitative measures in place UCs: q 2-3 min, mild per pt report and by palpation  Prenatal labs: ABO, Rh: --/--/O POS, O POS (07/07 2214) Antibody: NEG (07/07 2214) Rubella: Immune RPR: Nonreactive (12/20 0000)  HBsAg: Negative (12/20 0000)  HIV: Non-reactive (12/20 0000)  GBS: Positive (06/09 0000)   Assessment: IUP at 40.0  wks IOL due to oligohydramnios & Cat 2 FHRT Latent phase labor Overall reassuring FHRT GBS positive Unfavorable cvx Obesity (BMI 32.45)  Plan: Admit to Birthing Suite per consult with Dr. Estanislado Pandyivard. Routine [facility] orders. Pain med/epidural prn. Reviewed plan of care with patient & FOB, including rationale and processes of induction, to include foley bulb, AROM and Pitocin. Risks and benefits of induction were reviewed, including failure of method, prolonged labor, need for further intervention, and risk of cesarean. PCN G for GBS prophylaxis per standard dosing with ROM, active labor or initiation of Pitocin.  Consult as indicated. Expect progress and SVD.  Sherre ScarletKimberly Jelissa Espiritu, CNM 06/21/16, 09:46 PM

## 2016-06-21 NOTE — MAU Note (Signed)
C/o intermittent ucs since yesterday morning; denies SROM; ? Blood show this AM;

## 2016-06-21 NOTE — MAU Note (Signed)
Was contracting yesterday and last night.  Still having some contractions, feeling really crampy.  "Had her show" this morning.  Still having a small amt of bloody mucous. No leaking.

## 2016-06-21 NOTE — MAU Provider Note (Signed)
History     CSN: [id number]  Arrival date and time: 06/21/16 1356   None     Chief Complaint  Patient presents with  . Labor Eval   HPI Comments: [id number] @40 .0 wks here for labor eval and reports decreased FM over last 2 wks. She denies VB, LOF, and regular ctx. She was noted to have decels on NST and MAU provider was requested to evaluate. Her pregnancy has been uncomplicated.    OB History    Gravida Para Term Preterm AB TAB SAB Ectopic Multiple Living   4 2 2  1  1   2       Past Medical History  Diagnosis Date  . Allergy   . Anxiety     Past Surgical History  Procedure Laterality Date  . No past surgeries      Family History  Problem Relation Age of Onset  . Arthritis Mother   . Cancer Mother     breast  . Kidney failure Mother   . Arthritis Father     Social History  Substance Use Topics  . Smoking status: Former Games developermoker  . Smokeless tobacco: None  . Alcohol Use: No    Allergies:  Allergies  Allergen Reactions  . Hydrocodone Nausea Only    Prescriptions prior to admission  Medication Sig Dispense Refill Last Dose  . calcium carbonate (TUMS - DOSED IN MG ELEMENTAL CALCIUM) 500 MG chewable tablet Chew 2 tablets by mouth 2 (two) times daily as needed for indigestion or heartburn.   06/17/2016  . docusate sodium (COLACE) 100 MG capsule Take 100 mg by mouth daily as needed for mild constipation.   06/17/2016  . ranitidine (ZANTAC) 150 MG tablet Take 150 mg by mouth 2 (two) times daily as needed for heartburn.   06/21/2016 at Unknown time  . HYDROmorphone (DILAUDID) 2 MG tablet Take 0.5 tablets (1 mg total) by mouth every 6 (six) hours as needed for severe pain. (Patient not taking: Reported on 06/21/2016) 20 tablet 0   . Prenatal Vit-Fe Fumarate-FA (PRENATAL MULTIVITAMIN) TABS tablet Take 1 tablet by mouth daily at 12 noon.   06/17/2016  . promethazine (PHENERGAN) 12.5 MG tablet Take 1 tablet (12.5 mg total) by mouth every 6 (six) hours as needed for nausea or  vomiting. (Patient not taking: Reported on 06/21/2016) 30 tablet 0     Review of Systems  Constitutional: Negative.   HENT: Negative.   Eyes: Negative.   Respiratory: Negative.   Cardiovascular: Negative.   Gastrointestinal: Negative.   Genitourinary: Negative.   Musculoskeletal: Negative.   Skin: Negative.   Neurological: Negative.   Endo/Heme/Allergies: Negative.   Psychiatric/Behavioral: Negative.    Physical Exam   Blood pressure 125/85, pulse 91, temperature 98.4 F (36.9 C), temperature source Oral, resp. rate 18, last menstrual period 06/27/2015.  Physical Exam  Constitutional: She is oriented to person, place, and time. She appears well-developed and well-nourished.  HENT:  Head: Normocephalic.  Neck: Normal range of motion.  Cardiovascular: Normal rate.   Respiratory: Effort normal.  GI: Soft.  gravid  Musculoskeletal: Normal range of motion.  Neurological: She is alert and oriented to person, place, and time.  Skin: Skin is warm and dry.  Psychiatric: She has a normal mood and affect.   EFM: 150 bpm, mod variability, +accels, variable decels Toco: irregular, mild  MAU Course  Procedures  MDM 1620-spoke with Dr. Jaymes GraffKulwa-MD reviewed tracing. BPP 8/8 AFI 2.3cm. 1740-call to Dr. Sallye OberKulwa, no answer 1909-Transfer of care  given to K. Mayford KnifeWilliams, CNM  Assessment and Plan  [redacted] weeks gestation Oligohydramnios  April Sanchez 06/21/2016, 4:24 PM

## 2016-06-22 ENCOUNTER — Encounter (HOSPITAL_COMMUNITY)
Admission: AD | Disposition: A | Discharge status: Home / Self Care | Payer: Self-pay | Source: Ambulatory Visit | Attending: Obstetrics and Gynecology

## 2016-06-22 ENCOUNTER — Inpatient Hospital Stay (HOSPITAL_COMMUNITY): Payer: Medicaid Other | Admitting: Anesthesiology

## 2016-06-22 ENCOUNTER — Encounter (HOSPITAL_COMMUNITY): Payer: Self-pay | Admitting: Anesthesiology

## 2016-06-22 DIAGNOSIS — Z98891 History of uterine scar from previous surgery: Secondary | ICD-10-CM

## 2016-06-22 LAB — ABO/RH: ABO/RH(D): O POS

## 2016-06-22 LAB — RPR: RPR Ser Ql: NONREACTIVE

## 2016-06-22 SURGERY — Surgical Case
Anesthesia: Epidural

## 2016-06-22 MED ORDER — IBUPROFEN 600 MG PO TABS
600.0000 mg | ORAL_TABLET | Freq: Four times a day (QID) | ORAL | Status: DC | PRN
  Administered 2016-06-23 – 2016-06-24 (×2): 600 mg via ORAL
  Filled 2016-06-22: qty 1

## 2016-06-22 MED ORDER — SODIUM BICARBONATE 8.4 % IV SOLN
INTRAVENOUS | Status: DC | PRN
  Administered 2016-06-22 (×2): 5 mL via EPIDURAL
  Administered 2016-06-22 (×2): 3 mL via EPIDURAL

## 2016-06-22 MED ORDER — CEFAZOLIN SODIUM-DEXTROSE 2-4 GM/100ML-% IV SOLN
INTRAVENOUS | Status: AC
  Filled 2016-06-22: qty 100

## 2016-06-22 MED ORDER — ACETAMINOPHEN 325 MG PO TABS
650.0000 mg | ORAL_TABLET | ORAL | Status: DC | PRN
  Administered 2016-06-23: 650 mg via ORAL
  Filled 2016-06-22: qty 2

## 2016-06-22 MED ORDER — TERBUTALINE SULFATE 1 MG/ML IJ SOLN
0.2500 mg | Freq: Once | INTRAMUSCULAR | Status: AC | PRN
  Administered 2016-06-22: 0.25 mg via SUBCUTANEOUS
  Filled 2016-06-22: qty 1

## 2016-06-22 MED ORDER — NALBUPHINE HCL 10 MG/ML IJ SOLN: 5.0000 mg | INTRAMUSCULAR | Status: DC | PRN

## 2016-06-22 MED ORDER — EPHEDRINE 5 MG/ML INJ: 10.0000 mg | INTRAVENOUS | Status: DC | PRN

## 2016-06-22 MED ORDER — PRENATAL MULTIVITAMIN CH
1.0000 | ORAL_TABLET | Freq: Every day | ORAL | Status: DC
  Administered 2016-06-23 – 2016-06-25 (×2): 1 via ORAL
  Filled 2016-06-22 (×3): qty 1

## 2016-06-22 MED ORDER — ONDANSETRON HCL 4 MG/2ML IJ SOLN
INTRAMUSCULAR | Status: DC | PRN
  Administered 2016-06-22: 4 mg via INTRAVENOUS

## 2016-06-22 MED ORDER — SENNOSIDES-DOCUSATE SODIUM 8.6-50 MG PO TABS
2.0000 | ORAL_TABLET | ORAL | Status: DC
  Administered 2016-06-23 – 2016-06-24 (×3): 2 via ORAL
  Filled 2016-06-22 (×3): qty 2

## 2016-06-22 MED ORDER — IBUPROFEN 600 MG PO TABS
600.0000 mg | ORAL_TABLET | Freq: Four times a day (QID) | ORAL | Status: DC
  Administered 2016-06-22 – 2016-06-25 (×10): 600 mg via ORAL
  Filled 2016-06-22 (×12): qty 1

## 2016-06-22 MED ORDER — PHENYLEPHRINE HCL 10 MG/ML IJ SOLN
INTRAMUSCULAR | Status: DC | PRN
  Administered 2016-06-22: 80 ug via INTRAVENOUS
  Administered 2016-06-22: 40 ug via INTRAVENOUS

## 2016-06-22 MED ORDER — PHENYLEPHRINE 40 MCG/ML (10ML) SYRINGE FOR IV PUSH (FOR BLOOD PRESSURE SUPPORT): 80.0000 ug | PREFILLED_SYRINGE | INTRAVENOUS | Status: DC | PRN

## 2016-06-22 MED ORDER — MEPERIDINE HCL 25 MG/ML IJ SOLN: 6.2500 mg | INTRAMUSCULAR | Status: DC | PRN

## 2016-06-22 MED ORDER — KETOROLAC TROMETHAMINE 30 MG/ML IJ SOLN
INTRAMUSCULAR | Status: AC
  Filled 2016-06-22: qty 1

## 2016-06-22 MED ORDER — COCONUT OIL OIL: 1.0000 "application " | TOPICAL_OIL | Status: DC | PRN

## 2016-06-22 MED ORDER — OXYTOCIN 40 UNITS IN LACTATED RINGERS INFUSION - SIMPLE MED
1.0000 m[IU]/min | INTRAVENOUS | Status: DC
  Administered 2016-06-22: 1 m[IU]/min via INTRAVENOUS

## 2016-06-22 MED ORDER — KETOROLAC TROMETHAMINE 30 MG/ML IJ SOLN: 30.0000 mg | Freq: Four times a day (QID) | INTRAMUSCULAR | Status: AC | PRN

## 2016-06-22 MED ORDER — LIDOCAINE-EPINEPHRINE (PF) 2 %-1:200000 IJ SOLN
INTRAMUSCULAR | Status: AC
  Filled 2016-06-22: qty 20

## 2016-06-22 MED ORDER — BUPIVACAINE HCL (PF) 0.25 % IJ SOLN
INTRAMUSCULAR | Status: DC | PRN
  Administered 2016-06-22: 20 mL

## 2016-06-22 MED ORDER — WITCH HAZEL-GLYCERIN EX PADS: 1.0000 "application " | MEDICATED_PAD | CUTANEOUS | Status: DC | PRN

## 2016-06-22 MED ORDER — PENICILLIN G POTASSIUM 5000000 UNITS IJ SOLR
5.0000 10*6.[IU] | Freq: Once | INTRAVENOUS | Status: AC
  Administered 2016-06-22: 5 10*6.[IU] via INTRAVENOUS
  Filled 2016-06-22: qty 5

## 2016-06-22 MED ORDER — OXYCODONE-ACETAMINOPHEN 5-325 MG PO TABS
1.0000 | ORAL_TABLET | ORAL | Status: DC | PRN
  Administered 2016-06-22 – 2016-06-23 (×3): 1 via ORAL
  Filled 2016-06-22 (×3): qty 1

## 2016-06-22 MED ORDER — MORPHINE SULFATE (PF) 0.5 MG/ML IJ SOLN
INTRAMUSCULAR | Status: DC | PRN
  Administered 2016-06-22: 1 mg via INTRAVENOUS
  Administered 2016-06-22: 4 mg via EPIDURAL

## 2016-06-22 MED ORDER — FENTANYL CITRATE (PF) 100 MCG/2ML IJ SOLN
100.0000 ug | Freq: Once | INTRAMUSCULAR | Status: AC
  Administered 2016-06-22: 100 ug via INTRAVENOUS

## 2016-06-22 MED ORDER — METHYLERGONOVINE MALEATE 0.2 MG PO TABS: 0.2000 mg | ORAL_TABLET | ORAL | Status: DC | PRN

## 2016-06-22 MED ORDER — ONDANSETRON HCL 4 MG/2ML IJ SOLN: 4.0000 mg | Freq: Three times a day (TID) | INTRAMUSCULAR | Status: DC | PRN

## 2016-06-22 MED ORDER — NALBUPHINE HCL 10 MG/ML IJ SOLN: 5.0000 mg | Freq: Once | INTRAMUSCULAR | Status: DC | PRN

## 2016-06-22 MED ORDER — OXYTOCIN 40 UNITS IN LACTATED RINGERS INFUSION - SIMPLE MED: 2.5000 [IU]/h | INTRAVENOUS | Status: AC

## 2016-06-22 MED ORDER — LACTATED RINGERS IV SOLN
INTRAVENOUS | Status: DC | PRN
  Administered 2016-06-22: 06:00:00 via INTRAVENOUS

## 2016-06-22 MED ORDER — SIMETHICONE 80 MG PO CHEW
80.0000 mg | CHEWABLE_TABLET | ORAL | Status: DC
  Administered 2016-06-23 – 2016-06-24 (×3): 80 mg via ORAL
  Filled 2016-06-22 (×3): qty 1

## 2016-06-22 MED ORDER — MORPHINE SULFATE (PF) 0.5 MG/ML IJ SOLN
INTRAMUSCULAR | Status: AC
  Filled 2016-06-22: qty 10

## 2016-06-22 MED ORDER — PHENYLEPHRINE 40 MCG/ML (10ML) SYRINGE FOR IV PUSH (FOR BLOOD PRESSURE SUPPORT)
PREFILLED_SYRINGE | INTRAVENOUS | Status: AC
  Filled 2016-06-22: qty 20

## 2016-06-22 MED ORDER — FENTANYL CITRATE (PF) 100 MCG/2ML IJ SOLN: 25.0000 ug | INTRAMUSCULAR | Status: DC | PRN

## 2016-06-22 MED ORDER — SODIUM CHLORIDE 0.9 % IR SOLN
Status: DC | PRN
  Administered 2016-06-22: 1

## 2016-06-22 MED ORDER — BUPIVACAINE HCL (PF) 0.25 % IJ SOLN
INTRAMUSCULAR | Status: AC
  Filled 2016-06-22: qty 20

## 2016-06-22 MED ORDER — OXYTOCIN 10 UNIT/ML IJ SOLN
40.0000 [IU] | INTRAVENOUS | Status: DC | PRN
  Administered 2016-06-22: 40 [IU] via INTRAVENOUS

## 2016-06-22 MED ORDER — OXYTOCIN 40 UNITS IN LACTATED RINGERS INFUSION - SIMPLE MED
1.0000 m[IU]/min | INTRAVENOUS | Status: DC
  Administered 2016-06-22: 2 m[IU]/min via INTRAVENOUS

## 2016-06-22 MED ORDER — ZOLPIDEM TARTRATE 5 MG PO TABS: 5.0000 mg | ORAL_TABLET | Freq: Every evening | ORAL | Status: DC | PRN

## 2016-06-22 MED ORDER — FENTANYL 2.5 MCG/ML BUPIVACAINE 1/10 % EPIDURAL INFUSION (WH - ANES)
14.0000 mL/h | INTRAMUSCULAR | Status: DC | PRN
  Administered 2016-06-22 (×2): 14 mL/h via EPIDURAL

## 2016-06-22 MED ORDER — METHYLERGONOVINE MALEATE 0.2 MG/ML IJ SOLN: 0.2000 mg | INTRAMUSCULAR | Status: DC | PRN

## 2016-06-22 MED ORDER — PHENYLEPHRINE 40 MCG/ML (10ML) SYRINGE FOR IV PUSH (FOR BLOOD PRESSURE SUPPORT)
PREFILLED_SYRINGE | INTRAVENOUS | Status: AC
  Filled 2016-06-22: qty 10

## 2016-06-22 MED ORDER — OXYTOCIN 10 UNIT/ML IJ SOLN
INTRAMUSCULAR | Status: AC
  Filled 2016-06-22: qty 1

## 2016-06-22 MED ORDER — KETOROLAC TROMETHAMINE 30 MG/ML IJ SOLN
30.0000 mg | Freq: Four times a day (QID) | INTRAMUSCULAR | Status: AC | PRN
  Administered 2016-06-22: 30 mg via INTRAMUSCULAR

## 2016-06-22 MED ORDER — DIBUCAINE 1 % RE OINT: 1.0000 "application " | TOPICAL_OINTMENT | RECTAL | Status: DC | PRN

## 2016-06-22 MED ORDER — DIPHENHYDRAMINE HCL 50 MG/ML IJ SOLN: 12.5000 mg | INTRAMUSCULAR | Status: DC | PRN

## 2016-06-22 MED ORDER — FENTANYL CITRATE (PF) 100 MCG/2ML IJ SOLN
INTRAMUSCULAR | Status: AC
Start: 2016-06-22 — End: 2016-06-22
  Filled 2016-06-22: qty 2

## 2016-06-22 MED ORDER — MEASLES, MUMPS & RUBELLA VAC ~~LOC~~ INJ
0.5000 mL | INJECTION | Freq: Once | SUBCUTANEOUS | Status: DC
  Filled 2016-06-22: qty 0.5

## 2016-06-22 MED ORDER — SIMETHICONE 80 MG PO CHEW: 80.0000 mg | CHEWABLE_TABLET | ORAL | Status: DC | PRN

## 2016-06-22 MED ORDER — LACTATED RINGERS IV SOLN
INTRAVENOUS | Status: DC
  Administered 2016-06-22: 04:00:00 via INTRAUTERINE

## 2016-06-22 MED ORDER — DIPHENHYDRAMINE HCL 50 MG/ML IJ SOLN
12.5000 mg | INTRAMUSCULAR | Status: DC | PRN
  Administered 2016-06-22: 12.5 mg via INTRAVENOUS
  Filled 2016-06-22: qty 1

## 2016-06-22 MED ORDER — ONDANSETRON HCL 4 MG/2ML IJ SOLN
INTRAMUSCULAR | Status: AC
  Filled 2016-06-22: qty 2

## 2016-06-22 MED ORDER — LACTATED RINGERS IV SOLN: INTRAVENOUS | Status: DC

## 2016-06-22 MED ORDER — FERROUS SULFATE 325 (65 FE) MG PO TABS
325.0000 mg | ORAL_TABLET | Freq: Two times a day (BID) | ORAL | Status: DC
Start: 2016-06-22 — End: 2016-06-25
  Administered 2016-06-22 – 2016-06-25 (×5): 325 mg via ORAL
  Filled 2016-06-22 (×6): qty 1

## 2016-06-22 MED ORDER — LACTATED RINGERS IV SOLN: 500.0000 mL | Freq: Once | INTRAVENOUS | Status: DC

## 2016-06-22 MED ORDER — NALOXONE HCL 0.4 MG/ML IJ SOLN: 0.4000 mg | INTRAMUSCULAR | Status: DC | PRN

## 2016-06-22 MED ORDER — SIMETHICONE 80 MG PO CHEW
80.0000 mg | CHEWABLE_TABLET | Freq: Three times a day (TID) | ORAL | Status: DC
  Administered 2016-06-22 – 2016-06-25 (×8): 80 mg via ORAL
  Filled 2016-06-22 (×8): qty 1

## 2016-06-22 MED ORDER — SODIUM CHLORIDE 0.9% FLUSH: 3.0000 mL | INTRAVENOUS | Status: DC | PRN

## 2016-06-22 MED ORDER — CEFAZOLIN SODIUM-DEXTROSE 2-3 GM-% IV SOLR
INTRAVENOUS | Status: DC | PRN
  Administered 2016-06-22: 2 g via INTRAVENOUS

## 2016-06-22 MED ORDER — NALOXONE HCL 2 MG/2ML IJ SOSY
1.0000 ug/kg/h | PREFILLED_SYRINGE | INTRAMUSCULAR | Status: DC | PRN
  Filled 2016-06-22: qty 2

## 2016-06-22 MED ORDER — FENTANYL 2.5 MCG/ML BUPIVACAINE 1/10 % EPIDURAL INFUSION (WH - ANES)
INTRAMUSCULAR | Status: DC
Start: 2016-06-22 — End: 2016-06-22
  Filled 2016-06-22: qty 125

## 2016-06-22 MED ORDER — DIPHENHYDRAMINE HCL 25 MG PO CAPS: 25.0000 mg | ORAL_CAPSULE | ORAL | Status: DC | PRN

## 2016-06-22 MED ORDER — PENICILLIN G POTASSIUM 5000000 UNITS IJ SOLR
2.5000 10*6.[IU] | INTRAMUSCULAR | Status: DC
  Filled 2016-06-22 (×3): qty 2.5

## 2016-06-22 MED ORDER — OXYCODONE-ACETAMINOPHEN 5-325 MG PO TABS
2.0000 | ORAL_TABLET | ORAL | Status: DC | PRN
  Administered 2016-06-24 – 2016-06-25 (×6): 2 via ORAL
  Filled 2016-06-22 (×6): qty 2

## 2016-06-22 MED ORDER — TETANUS-DIPHTH-ACELL PERTUSSIS 5-2.5-18.5 LF-MCG/0.5 IM SUSP: 0.5000 mL | Freq: Once | INTRAMUSCULAR | Status: DC

## 2016-06-22 MED ORDER — SCOPOLAMINE 1 MG/3DAYS TD PT72
MEDICATED_PATCH | TRANSDERMAL | Status: DC | PRN
  Administered 2016-06-22: 1 via TRANSDERMAL

## 2016-06-22 MED ORDER — LIDOCAINE HCL (PF) 1 % IJ SOLN
INTRAMUSCULAR | Status: DC | PRN
  Administered 2016-06-22 (×2): 4 mL via EPIDURAL

## 2016-06-22 MED ORDER — DIPHENHYDRAMINE HCL 25 MG PO CAPS
25.0000 mg | ORAL_CAPSULE | Freq: Four times a day (QID) | ORAL | Status: DC | PRN
  Administered 2016-06-22: 25 mg via ORAL
  Filled 2016-06-22: qty 1

## 2016-06-22 MED ORDER — CEFAZOLIN SODIUM-DEXTROSE 2-4 GM/100ML-% IV SOLN: 2.0000 g | INTRAVENOUS | Status: DC

## 2016-06-22 MED ORDER — MENTHOL 3 MG MT LOZG: 1.0000 | LOZENGE | OROMUCOSAL | Status: DC | PRN

## 2016-06-22 MED ORDER — TERBUTALINE SULFATE 1 MG/ML IJ SOLN
INTRAMUSCULAR | Status: AC
  Filled 2016-06-22: qty 1

## 2016-06-22 MED ORDER — OXYTOCIN 10 UNIT/ML IJ SOLN
INTRAMUSCULAR | Status: AC
Start: 2016-06-22 — End: 2016-06-22
  Filled 2016-06-22: qty 3

## 2016-06-22 MED ORDER — ACETAMINOPHEN 500 MG PO TABS
1000.0000 mg | ORAL_TABLET | Freq: Four times a day (QID) | ORAL | Status: AC
  Administered 2016-06-22: 1000 mg via ORAL
  Filled 2016-06-22: qty 2

## 2016-06-22 SURGICAL SUPPLY — 39 items
APL SKNCLS STERI-STRIP NONHPOA (GAUZE/BANDAGES/DRESSINGS) ×1
BENZOIN TINCTURE PRP APPL 2/3 (GAUZE/BANDAGES/DRESSINGS) ×2 IMPLANT
BOOTIES KNEE HIGH SLOAN (MISCELLANEOUS) ×4 IMPLANT
CLAMP CORD UMBIL (MISCELLANEOUS) IMPLANT
CLOTH BEACON ORANGE TIMEOUT ST (SAFETY) ×2 IMPLANT
DRAIN JACKSON PRT FLT 10 (DRAIN) IMPLANT
DRSG OPSITE POSTOP 4X10 (GAUZE/BANDAGES/DRESSINGS) ×2 IMPLANT
DURAPREP 26ML APPLICATOR (WOUND CARE) ×2 IMPLANT
ELECT REM PT RETURN 9FT ADLT (ELECTROSURGICAL) ×2
ELECTRODE REM PT RTRN 9FT ADLT (ELECTROSURGICAL) ×1 IMPLANT
EVACUATOR SILICONE 100CC (DRAIN) IMPLANT
EXTRACTOR VACUUM M CUP 4 TUBE (SUCTIONS) IMPLANT
GLOVE BIOGEL PI IND STRL 7.0 (GLOVE) ×2 IMPLANT
GLOVE BIOGEL PI INDICATOR 7.0 (GLOVE) ×2
GLOVE ECLIPSE 6.5 STRL STRAW (GLOVE) ×2 IMPLANT
GOWN STRL REUS W/TWL LRG LVL3 (GOWN DISPOSABLE) ×4 IMPLANT
KIT ABG SYR 3ML LUER SLIP (SYRINGE) IMPLANT
NDL HYPO 25X5/8 SAFETYGLIDE (NEEDLE) IMPLANT
NEEDLE HYPO 22GX1.5 SAFETY (NEEDLE) ×2 IMPLANT
NEEDLE HYPO 25X5/8 SAFETYGLIDE (NEEDLE) IMPLANT
NS IRRIG 1000ML POUR BTL (IV SOLUTION) ×4 IMPLANT
PACK C SECTION WH (CUSTOM PROCEDURE TRAY) ×2 IMPLANT
PAD OB MATERNITY 4.3X12.25 (PERSONAL CARE ITEMS) ×2 IMPLANT
PENCIL SMOKE EVAC W/HOLSTER (ELECTROSURGICAL) ×2 IMPLANT
RTRCTR C-SECT PINK 25CM LRG (MISCELLANEOUS) ×2 IMPLANT
STRIP CLOSURE SKIN 1/2X4 (GAUZE/BANDAGES/DRESSINGS) ×2 IMPLANT
SUT MNCRL AB 3-0 PS2 27 (SUTURE) ×2 IMPLANT
SUT SILK 2 0 FSL 18 (SUTURE) IMPLANT
SUT VIC AB 0 CTX 36 (SUTURE) ×6
SUT VIC AB 0 CTX36XBRD ANBCTRL (SUTURE) ×2 IMPLANT
SUT VIC AB 1 CT1 36 (SUTURE) ×4 IMPLANT
SUT VIC AB 2-0 CT1 27 (SUTURE)
SUT VIC AB 2-0 CT1 TAPERPNT 27 (SUTURE) IMPLANT
SUT VIC AB 3-0 PS2 18 (SUTURE) ×2
SUT VIC AB 3-0 PS2 18XBRD (SUTURE) IMPLANT
SYR 20CC LL (SYRINGE) ×2 IMPLANT
TAPE STRIPS DRAPE STRL (GAUZE/BANDAGES/DRESSINGS) ×1 IMPLANT
TOWEL OR 17X24 6PK STRL BLUE (TOWEL DISPOSABLE) ×2 IMPLANT
TRAY FOLEY CATH SILVER 14FR (SET/KITS/TRAYS/PACK) ×2 IMPLANT

## 2016-06-22 NOTE — Transfer of Care (Signed)
Immediate Anesthesia Transfer of Care Note  Patient: April Sanchez  Procedure(s) Performed: Procedure(s): CESAREAN SECTION (N/A)  Patient Location: PACU  Anesthesia Type:Epidural  Level of Consciousness: awake and alert   Airway & Oxygen Therapy: Patient Spontanous Breathing  Post-op Assessment: Report given to RN and Post -op Vital signs reviewed and stable  Post vital signs: Reviewed and stable  Last Vitals:  Filed Vitals:   06/22/16 0558 06/22/16 0605  BP: 116/75 117/80  Pulse: 170 166  Temp:    Resp:      Last Pain:  Filed Vitals:   06/22/16 0728  PainSc: Asleep      Patients Stated Pain Goal: 8 (06/21/16 2219)  Complications: No apparent anesthesia complications

## 2016-06-22 NOTE — Anesthesia Postprocedure Evaluation (Signed)
Anesthesia Post Note  Patient: April Sanchez  Procedure(s) Performed: Procedure(s) (LRB): CESAREAN SECTION (N/A)  Patient location during evaluation: PACU Anesthesia Type: Epidural Level of consciousness: awake and alert and oriented Pain management: pain level controlled Vital Signs Assessment: post-procedure vital signs reviewed and stable Respiratory status: spontaneous breathing, nonlabored ventilation and respiratory function stable Cardiovascular status: blood pressure returned to baseline and stable Postop Assessment: no signs of nausea or vomiting, patient able to bend at knees, epidural receding, no backache and no headache Anesthetic complications: no     Last Vitals:  Filed Vitals:   06/22/16 0800 06/22/16 0815  BP: 105/69 110/77  Pulse: 78 76  Temp:    Resp: 16 18    Last Pain:  Filed Vitals:   06/22/16 0833  PainSc: 0-No pain   Pain Goal: Patients Stated Pain Goal: 8 (06/21/16 2219)               [name] A.

## 2016-06-22 NOTE — Anesthesia Preprocedure Evaluation (Addendum)
Anesthesia Evaluation  Patient identified by MRN, date of birth, ID band Patient awake    Reviewed: Allergy & Precautions, Patient's Chart, lab work & pertinent test results  Airway Mallampati: III  TM Distance: >3 FB Neck ROM: Full    Dental no notable dental hx. (+) Teeth Intact   Pulmonary former smoker,    Pulmonary exam normal breath sounds clear to auscultation       Cardiovascular negative cardio ROS   Rhythm:Regular Rate:Normal     Neuro/Psych Anxiety negative neurological ROS     GI/Hepatic Neg liver ROS, GERD  Medicated and Controlled,  Endo/Other  Obesity Hx/o nontoxic, multinodular goiter  Renal/GU negative Renal ROS     Musculoskeletal negative musculoskeletal ROS (+)   Abdominal (+) + obese,   Peds  Hematology negative hematology ROS (+)   Anesthesia Other Findings   Reproductive/Obstetrics                           Anesthesia Physical Anesthesia Plan  ASA: II and emergent  Anesthesia Plan: Epidural   Post-op Pain Management:    Induction:   Airway Management Planned: Natural Airway  Additional Equipment:   Intra-op Plan:   Post-operative Plan:   Informed Consent: I have reviewed the patients History and Physical, chart, labs and discussed the procedure including the risks, benefits and alternatives for the proposed anesthesia with the patient or authorized representative who has indicated his/her understanding and acceptance.   Dental advisory given  Plan Discussed with: Anesthesiologist, CRNA and Surgeon  Anesthesia Plan Comments: (Patient for urgent C/Section for non reassuring FHR tracing. Will use epidural for C/section. )      Anesthesia Quick Evaluation

## 2016-06-22 NOTE — Op Note (Signed)
Preoperative diagnosis: Intrauterine pregnancy at 40 weeks and 1 day with fetal intolerance to labor  Post operative diagnosis: Same with left tubal cyst  Anesthesia: Epidural  Anesthesiologist: Dr. Malen GauzeFoster  Procedure: Primary low transverse cesarean section and left removal of tubal cyst  Surgeon: Dr. Dois DavenportSandra Ruth Kovich  Assistant: Merlinda FrederickKimberley Williams CNM  Estimated blood loss: 600 cc  Procedure:  After being informed of the planned procedure and possible complications including bleeding, infection, injury to other organs, informed consent is obtained. The patient is taken to OR #9 and pre-existing epidural anesthesia is optimized without complication. She is placed in the dorsal decubitus position with the pelvis tilted to the left. She is then prepped and draped in a sterile fashion. A Foley catheter is  in her bladder.  After assessing adequate level of anesthesia, we infiltrate the suprapubic area with 20 cc of Marcaine 0.25 and perform a Pfannenstiel incision which is brought down sharply to the fascia. The fascia is entered in a low transverse fashion. Linea alba is dissected. Peritoneum is entered in a midline fashion. An Alexis retractor is easily positioned.   The myometrium is then entered in a low transverse fashion, 2 cm above the vesico-uterine junction ; first with knife and then extended bluntly. Amniotic fluid is old thick meconium. We assist the birth of a female  infant in vertex presentation. Mouth and nose are suctioned. 1 nuchal cord is reduced. The baby is delivered. The cord is clamped and sectioned. The baby is given to the neonatologist present in the room.  10 cc of blood is drawn from the umbilical vein and an arterial cord gas is collected. The placenta is allowed to deliver spontaneously. It is complete and the cord has 3 vessels. Uterine revision is negative.  We proceed with closure of the myometrium in 2 layers: First with a running locked suture of 0 Vicryl, then  with a Lembert suture of 0 Vicryl imbricating the first one. Hemostasis is completed with cauterization on peritoneal edges and 2 figure-of-eight stitches of 0-Vicryl at the left angle.  Both paracolic gutters are cleaned. Both tubes and ovaries are assessed and normal except for a 5 cm left mesosalpynx cyst which contains a solid component.We proceed with its removal by superficially opening the capsule and bluntly dissecting the cyst off. The base is tied with a free tie of 3-0 Vicryl and the cyst is removed. Hemostasis is adequate. . The pelvis is profusely irrigated with warm saline to confirm a satisfactory hemostasis.  Retractors and sponges are removed. Under fascia hemostasis is completed with cauterization. The fascia is then closed with 2 running sutures of 0 Vicryl meeting midline. The wound is irrigated with warm saline and hemostasis is completed with cauterization. The skin is closed with a subcuticular suture of 3-0 Monocryl and Steri-Strips.  Instrument and sponge count is complete x2. Estimated blood loss is 600 cc.  The procedure is well tolerated by the patient who is taken to recovery room in a well and stable condition.  female baby named Jean RosenthalJackson was born at 6:40 and received an Apgar of 6  at 1 minute and 9 at 5 minutes. Cord pH is pending   Specimen: Placenta and left tubal cyst sent to pathology   [facility] Center[facility]IVARD,Arianny Pun A MD 7/8/20177:35 AM

## 2016-06-22 NOTE — Addendum Note (Signed)
Addendum  created 06/22/16 1316 by Yolonda KidaAlison L Teriah Muela, CRNA   Modules edited: Clinical Notes   Clinical Notes:  File: [id number]

## 2016-06-22 NOTE — Lactation Note (Signed)
This note was copied from a baby's chart. Lactation Consultation Note Initial visit at 10 hours of age. Mom is recovering from a c/s and very sleepy.  Mom reports baby has had low glucose levels needing supplementation.  LC offered to assist with DEBP set up.  Mom prefers not to pump, but is agreeable to establish a milk supply due to supplementation orders by MD.  FOB at bedside supportive. Mom shown how to use DEBP & how to disassemble, clean, & reassemble parts. Encouraged mom to pump every 3 hours after breast attempts and offer any EBM to baby.  Baby is asleep in crib.  Mom reports baby may have been without amniotic fluid for weeks prior to delivery. [facility] resources given and discussed.  Encouraged to feed with early cues on demand.  Early newborn behavior discussed and green communication sheet/ LPT policy discussed with parents due to LBW of 5#14 oz.  Hand expression reported by mom with colostrum visible.  Mom to call for assist as needed.     Patient Name: April Sanchez ZOXWR'UToday's Date: 06/22/2016 Reason for consult: Initial assessment;Infant < 6lbs;Other (Comment) (low glucose levels)   Maternal Data Has patient been taught Hand Expression?: Yes Does the patient have breastfeeding experience prior to this delivery?: Yes  Feeding Feeding Type: Formula Length of feed: 4 min (4 min per mother)  LATCH Score/Interventions Latch: Repeated attempts needed to sustain latch, nipple held in mouth throughout feeding, stimulation needed to elicit sucking reflex. Intervention(s): Adjust position;Assist with latch;Breast massage  Audible Swallowing: None Intervention(s): Skin to skin;Hand expression  Type of Nipple: Everted at rest and after stimulation  Comfort (Breast/Nipple): Soft / non-tender     Hold (Positioning): Assistance needed to correctly position infant at breast and maintain latch. Intervention(s): Breastfeeding basics reviewed;Skin to skin  LATCH Score: 6  Lactation  Tools Discussed/Used Pump Review: Setup, frequency, and cleaning;Milk Storage Initiated by:: JS Date initiated:: 06/22/16   Consult Status Consult Status: Follow-up Date: 06/23/16 Follow-up type: In-patient    Jannifer RodneyShoptaw, Jana Lynn 06/22/2016, 5:09 PM

## 2016-06-22 NOTE — Anesthesia Procedure Notes (Signed)
Epidural Patient location during procedure: OB Start time: 06/22/2016 2:47 AM  Staffing Anesthesiologist: Mal AmabileFOSTER, Cheyane Ayon Performed by: anesthesiologist   Preanesthetic Checklist Completed: patient identified, site marked, surgical consent, pre-op evaluation, timeout performed, IV checked, risks and benefits discussed and monitors and equipment checked  Epidural Patient position: sitting Prep: site prepped and draped and DuraPrep Patient monitoring: continuous pulse ox and blood pressure Approach: midline Location: L3-L4 Injection technique: LOR air  Needle:  Needle type: Tuohy  Needle gauge: 17 G Needle length: 9 cm and 9 Needle insertion depth: 5 cm cm Catheter type: closed end flexible Catheter size: 19 Gauge Catheter at skin depth: 10 cm Test dose: negative and Other  Assessment Events: blood not aspirated, injection not painful, no injection resistance, negative IV test and no paresthesia  Additional Notes Patient identified. Risks and benefits discussed including failed block, incomplete  Pain control, post dural puncture headache, nerve damage, paralysis, blood pressure Changes, nausea, vomiting, reactions to medications-both toxic and allergic and post Partum back pain. All questions were answered. Patient expressed understanding and wished to proceed. Sterile technique was used throughout procedure. Epidural site was Dressed with sterile barrier dressing. No paresthesias, signs of intravascular injection Or signs of intrathecal spread were encountered.  Patient was more comfortable after the epidural was dosed. Please see RN's note for documentation of vital signs and FHR which are stable.

## 2016-06-22 NOTE — Progress Notes (Signed)
  Subjective: Some cramping. Desires IV pain medication so that she can sleep. +FM. Denies LOF or VB.  Objective: BP 102/73 mmHg  Pulse 55  Temp(Src) 97.9 F (36.6 C) (Oral)  Resp 18  Ht 5\' 5"  (1.651 m)  Wt 98.884 kg (218 lb)  BMI 36.28 kg/m2  SpO2 99%  LMP 06/27/2015  Breastfeeding? Unknown I/O last 3 completed shifts: In: 500 [I.V.:500] Out: 800 [Urine:100; Blood:700] Total I/O In: 1700 [I.V.:1700] Out: 300 [Urine:300]  FHT: Category 2 UC: Difficult to trace SVE: 1/50/high (vtx not engaged). Cvx very posterior  Assessment:  IUP at term. No cervical change. Cat 2 FHRT. Attempted to place FB several times with and without speculum and IV Fentanyl w/o success. During my attempts, cvx advanced to 3 cm. Discussed findings w/ pt and family. Agreeable to Pitocin.    Plan: Begin Pitocin. Will AROM and place full internals and begin Amnioinfusion due to repetitive mild-moderate variables.  Sherre ScarletWILLIAMS, April Sanchez CNM 06/22/2016, 1:00 PM  ADDENDUM: Low dose Pitocin started, as well as PCN for GBS prophylaxis. Shared w/ family my concerns re: intermittent mod-severe variables. Recommended Nitrous Oxide (has to be able to hold her own mask) or Epidural. Pt had questions for Anesthesia regarding Epidural before consenting. Attempted to place full internals, but pt unable to tolerate procedure. Will re-attempt once epidural in place and pt is comfortable. Of note, no palpable BOW, able to feel sutures. Feel that pt ruptured some time ago which would explain the low AFI. She was unable to recall if she ruptured as the classic signs (gush, fluid running down leg) did not occur.  Sherre ScarletKimberly Brandley Sanchez, CNM 06/22/16, 3 AM  ADDENDUM: Variables continue in spite of position change, IVF bolus, 02 and shutting off Pitocin. Was not able to increase Pitocin beyond 2 mU/min. Tachysystole and some low BPs (after epidural) noted, which may have been a contributing factor of the mod-severe variables. Was able  to successfully place full internals and begin Amnioinfusion around 4 AM. Around 5 AM, mod-severe variables persisted and Dr. Estanislado Pandyivard was consulted. No improvement in tracing after several more attempts to correct variables. C-section recommended and both pt and Dr. Estanislado Pandyivard in agreement. Risks, Benefits, Alternatives including but not limited to bleeding, infection and injury were discussed with the patient. Team notified. Pt prepped for surgery. Preop orders placed (to include Ancef). Terb 0.25 sub q given as MVUs continued to be well over 200 and ctxs q 1-3 min. Pt for emergent c-section.   Sherre ScarletKimberly Katye Sanchez, CNM 06/22/16, 6 AM

## 2016-06-22 NOTE — Anesthesia Postprocedure Evaluation (Signed)
Anesthesia Post Note  Patient: April Sanchez  Procedure(s) Performed: Procedure(s) (LRB): CESAREAN SECTION (N/A)  Patient location during evaluation: Mother Baby Anesthesia Type: Epidural Level of consciousness: awake, awake and alert, oriented and patient cooperative Pain management: pain level controlled Vital Signs Assessment: post-procedure vital signs reviewed and stable Respiratory status: spontaneous breathing, nonlabored ventilation and respiratory function stable Cardiovascular status: stable Postop Assessment: no headache, no backache, patient able to bend at knees and no signs of nausea or vomiting Anesthetic complications: no     Last Vitals:  Filed Vitals:   06/22/16 0925 06/22/16 1035  BP: 115/79 102/73  Pulse: 54 55  Temp:  36.6 C  Resp: 16 18    Last Pain:  Filed Vitals:   06/22/16 1059  PainSc: 0-No pain   Pain Goal: Patients Stated Pain Goal: 8 (06/21/16 2219)               [name] L

## 2016-06-23 LAB — CBC
HCT: 33.3 % — ABNORMAL LOW (ref 36.0–46.0)
Hemoglobin: 10.7 g/dL — ABNORMAL LOW (ref 12.0–15.0)
MCH: 28.8 pg (ref 26.0–34.0)
MCHC: 32.1 g/dL (ref 30.0–36.0)
MCV: 89.8 fL (ref 78.0–100.0)
PLATELETS: 193 10*3/uL (ref 150–400)
RBC: 3.71 MIL/uL — AB (ref 3.87–5.11)
RDW: 14.2 % (ref 11.5–15.5)
WBC: 12.5 10*3/uL — ABNORMAL HIGH (ref 4.0–10.5)

## 2016-06-23 MED ORDER — BENZOCAINE-MENTHOL 20-0.5 % EX AERO
1.0000 "application " | INHALATION_SPRAY | CUTANEOUS | Status: DC | PRN
  Administered 2016-06-23: 1 via TOPICAL
  Filled 2016-06-23: qty 56

## 2016-06-23 NOTE — Progress Notes (Signed)
Pt sitting on the toilet stating an urge to void put unable to. Bladder scanned . I/o cath per protocol 800cc. Pt tolerated I/O cath well and states relief. Encouraged patient to drink several cups of water until able to void.

## 2016-06-23 NOTE — Progress Notes (Signed)
Subjective: Postpartum Day 2: Cesarean Delivery Patient reports that pain is well-managed.Lochia normal.  Ambulating, voiding, tolerating diet as ordered without difficulty. Normal flatus.  Absent bowel movement.  Objective: Vital signs in last 24 hours: Temp:  [97.8 F (36.6 C)-98.1 F (36.7 C)] 97.9 F (36.6 C) (07/09 0900) Pulse Rate:  [53-94] 62 (07/09 0900) Resp:  [16-18] 18 (07/09 0424) BP: (97-115)/(47-77) 111/70 mmHg (07/09 0900) SpO2:  [97 %] 97 % (07/09 0900)  Physical Exam:  General: alert Lochia: appropriate Uterine Fundus: firm and appropriately tender Incision: dressing dry and clean DVT Evaluation: No evidence of DVT seen on physical exam. Edema 1+   Recent Labs  06/21/16 2214 06/23/16 0642  HGB 12.8 10.7*  HCT 38.3 33.3*    Assessment/Plan: Status post Cesarean section. Doing well postoperatively.  Continue current care. Anticipate discharge tomorrow Outpatient circumcision  April Sanchez A 06/23/2016, 11:07 AM

## 2016-06-23 NOTE — Progress Notes (Signed)
CSW received referral to see pt for hx of anxiety/depression.  Pt admits that several years ago she was prescribed Xanax for anxiety attacks, but did not take it secondary to how it made her feel.  Pt currently denies any feelings of anxiety/depression, does not take medications for such and is not in therapy.  Pt did not experience and s/s of PPD with either of her other 2 children, but is confident that her/husband would recognize any changes in mood/behavior and are agreeable to seek medical f/u.  No other social work needs identified.  CSW signing off.  Please reconsult as necessary.  Pollyann SavoyJody Shadana Pry, LCSW [employer] [phone]

## 2016-06-24 ENCOUNTER — Encounter (HOSPITAL_COMMUNITY): Payer: Self-pay | Admitting: Obstetrics and Gynecology

## 2016-06-24 NOTE — Progress Notes (Signed)
Subjective: Postpartum Day 2: Cesarean Delivery Patient reports no problems voiding.  +flatus and +BM.  Tolerating po.  Objective: Vital signs in last 24 hours: Temp:  [98.2 F (36.8 C)] 98.2 F (36.8 C) (07/09 1701) Pulse Rate:  [68-75] 75 (07/10 0601) Resp:  [18-20] 18 (07/10 0601) BP: (111-116)/(54-61) 116/61 mmHg (07/10 0601) SpO2:  [99 %] 99 % (07/09 1701)  Physical Exam:  General: alert and no distress Lochia: appropriate Uterine Fundus: firm Incision: dressing c/d/i DVT Evaluation: No evidence of DVT seen on physical exam.   Recent Labs  06/21/16 2214 06/23/16 0642  HGB 12.8 10.7*  HCT 38.3 33.3*    Assessment/Plan: Status post Cesarean section. Doing well postoperatively.  Continue current care Plans outpatient circ and [facility]   Ezariah Nace Y 06/24/2016, 2:41 PM

## 2016-06-24 NOTE — Lactation Note (Signed)
This note was copied from a baby's chart. Lactation Consultation Note  Patient Name: Boy Donnal MoatStefanie Utz ZHYQM'VToday's Date: 06/24/2016 Reason for consult: Follow-up assessment;Other (Comment) (1% weight loss, Breast and supplementing per MD order )  Pecola LeisureBaby is 2659 hours old and has been to the breast consistently for a baby < 6 pounds and supplemented afterwards. Mec. At birth  And no stool since. Dr. Eather ColasAware per MBU RN Liliana Clinehris Wilson. While LC present mom latched baby , and LC assisted to flange lips  And improve depth. Multiply swallows noted, increased with breast compressions. Baby fed 8-10 mins , latch score to start 10 -  LC assisted minimal brought it down to 9.  LC discussed with mom the importance for watching for non - nutritive / hanging out feeding patterns, and if stimulation doesn't  Get the baby back into a active participating pattern , release suction, burp and see if the baby is still hungry.  LC stressed the importance of feeding the baby with feeding cues and at least every 3 hours. 30 mins max at the breast , and then  Supplement. Post pump after breast feeding , save milk, and feed back to baby. Per mom has pumped a few times in the last 24 hours  With small results, which was spoon fed back to baby. LC encouraged consistent post pumping , so baby can be supplemented with EBM and  It will enhance the baby to stool. Per mom breast are feeling fuller. Mom asked LC if when her milk comes in can she stop pumping.  LC responded it really depends how the baby is doing at the breast and since the baby is less than 6 pounds, extra pumping would be indicated.  Per mom has a DEBP at home.     Maternal Data    Feeding Feeding Type: Breast Fed Length of feed: 10 min (swallows noted . )  LATCH Score/Interventions Latch: Grasps breast easily, tongue down, lips flanged, rhythmical sucking. (baby needed intermittent stimulation during to feed ) Intervention(s): Adjust position;Assist with  latch;Breast massage;Breast compression  Audible Swallowing: Spontaneous and intermittent  Type of Nipple: Everted at rest and after stimulation  Comfort (Breast/Nipple): Soft / non-tender     Hold (Positioning): No assistance needed to correctly position infant at breast. Intervention(s): Breastfeeding basics reviewed;Support Pillows;Position options;Skin to skin  LATCH Score: 10  Lactation Tools Discussed/Used Tools: Pump Breast pump type: Double-Electric Breast Pump (permom has post pumped a few times a in the last 24 , w/ small am't - spoon fed back to baby )   Consult Status Consult Status: Follow-up Date: 06/25/16 Follow-up type: In-patient    Kathrin Greathouseorio, Breiona Couvillon Ann 06/24/2016, 6:29 PM

## 2016-06-24 NOTE — Lactation Note (Signed)
This note was copied from a baby's chart. Lactation Consultation Note  Patient Name: April Sanchez ZOXWR'UToday's Date: 06/24/2016 Reason for consult: Follow-up assessment Baby at 57 hr of life and dyad was asleep. FOB requested lactation come back later. Left instructions for mom to call at next feeding.   Maternal Data    Feeding Feeding Type: Bottle Fed - Formula Length of feed: 60 min  LATCH Score/Interventions                      Lactation Tools Discussed/Used     Consult Status Consult Status: Follow-up Date: 06/24/16 Follow-up type: In-patient    [name] 06/24/2016, 4:16 PM

## 2016-06-25 MED ORDER — IBUPROFEN 600 MG PO TABS: 600.0000 mg | ORAL_TABLET | Freq: Four times a day (QID) | ORAL | Status: AC | PRN

## 2016-06-25 MED ORDER — OXYCODONE-ACETAMINOPHEN 5-325 MG PO TABS: 1.0000 | ORAL_TABLET | Freq: Four times a day (QID) | ORAL | Status: AC | PRN

## 2016-06-25 NOTE — Discharge Summary (Signed)
Obstetric Discharge Summary3 Reason for Admission: induction of labor secondary to decreased FM and oligo Prenatal Procedures: ultrasound Intrapartum Procedures: C-Section Postpartum Procedures: none Complications-Operative and Postpartum: none HEMOGLOBIN  Date Value Ref Range Status  06/23/2016 10.7* 12.0 - 15.0 g/dL Final  16/10/960406/11/2013 54.014.2 12.2 - 16.2 g/dL Final   HCT  Date Value Ref Range Status  06/23/2016 33.3* 36.0 - 46.0 % Final   HCT, POC  Date Value Ref Range Status  05/27/2013 44.2 37.7 - 47.9 % Final    Physical Exam:  General: alert and no distress Lochia: appropriate Uterine Fundus: firm Incision: dressing c/d/i DVT Evaluation: No evidence of DVT seen on physical exam.  Discharge Diagnoses: Term Pregnancy-delivered  Discharge Information: Date: 06/25/2016 Activity: pelvic rest Diet: routine Medications: PNV, Ibuprofen and Percocet Condition: stable Instructions: refer to practice specific booklet Discharge to: home Follow-up Information    [facility]   Specialty:  Obstetrics and Gynecology   Why:  call for an appointment for post partum visit   Contact information:   [address] [place] [phone]      Newborn Data: Live born female  Birth Weight: 5 lb 14.4 oz (2675 g) APGAR: 6, 9  Home with mother.  Shiraz Bastyr Y 06/25/2016, 11:27 AM

## 2016-06-25 NOTE — Lactation Note (Signed)
This note was copied from a baby's chart. Lactation Consultation Note: Mother states that infant just finished a 15 min feeding. Infant showing cue and mother latched infant on the alternate breast. Observed infant with good burst of rhythmic suckling and audible swallows. Mother fed infant 30 ml of EBM after last feeding. Advised mother to post pump after this feeding and every feeding . Advised to supplement with EBM/formula. Mother receptive to all feeding. Mother states she is felling good breast changes and states her milk is coming in. Suggested breast massage with firmness. Mother advised to continue to breastfeed 8-12 times in 24 hours and do frequent skin to skin. Mother to page for assistance with next feeding.   Patient Name: April Donnal MoatStefanie Sanchez ZOXWR'UToday's Date: 06/25/2016 Reason for consult: Follow-up assessment   Maternal Data    Feeding Feeding Type: Breast Fed Length of feed: 15 min  LATCH Score/Interventions Latch: Grasps breast easily, tongue down, lips flanged, rhythmical sucking. Intervention(s): Adjust position  Audible Swallowing: A few with stimulation  Type of Nipple: Everted at rest and after stimulation  Comfort (Breast/Nipple): Soft / non-tender     Hold (Positioning): Assistance needed to correctly position infant at breast and maintain latch.  LATCH Score: 8  Lactation Tools Discussed/Used     Consult Status      Michel BickersKendrick, Orena Cavazos McCoy 06/25/2016, 9:37 AM

## 2017-08-30 ENCOUNTER — Ambulatory Visit (HOSPITAL_BASED_OUTPATIENT_CLINIC_OR_DEPARTMENT_OTHER)
Admission: RE | Admit: 2017-08-30 | Discharge: 2017-08-30 | Disposition: A | Discharge status: Home / Self Care | Payer: BLUE CROSS/BLUE SHIELD | Source: Ambulatory Visit | Attending: Family Medicine | Admitting: Family Medicine

## 2017-08-30 ENCOUNTER — Other Ambulatory Visit (HOSPITAL_BASED_OUTPATIENT_CLINIC_OR_DEPARTMENT_OTHER): Payer: Self-pay | Admitting: Family Medicine

## 2017-08-30 DIAGNOSIS — M79604 Pain in right leg: Secondary | ICD-10-CM | POA: Diagnosis present

## 2017-08-30 DIAGNOSIS — I8391 Asymptomatic varicose veins of right lower extremity: Secondary | ICD-10-CM | POA: Diagnosis not present

## 2018-11-06 IMAGING — US US EXTREM LOW VENOUS*R*
1 series · 13 of 24 positions shown · non-contrast
Comparison: None.

CLINICAL DATA: Right lower extremity pain. Palpable area at
location of known spider veins. History of smoking. Evaluate for
DVT.



[Series 1: us extrem low venous*right* · 0.06mm/px · 13 of 43 slices shown]
[im 1/43]
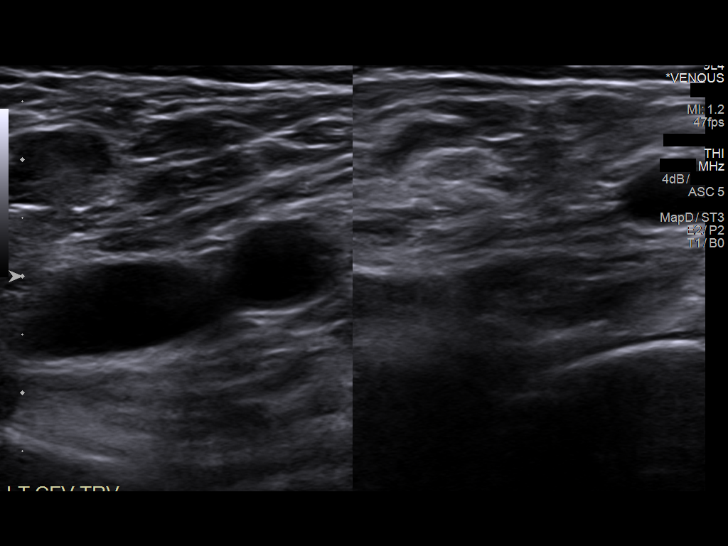
[im 4/43]
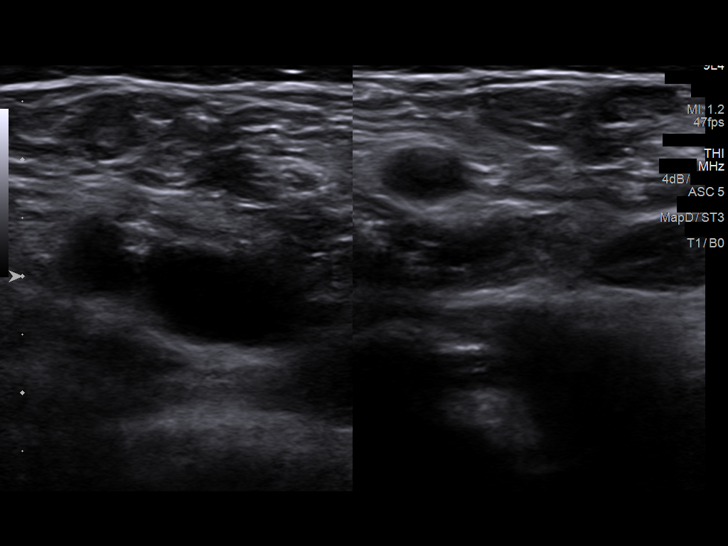
[im 8/43]
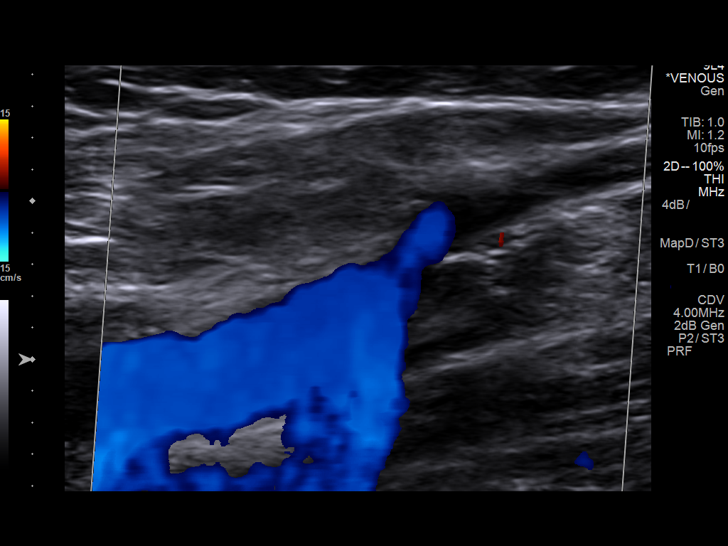
[im 11/43]
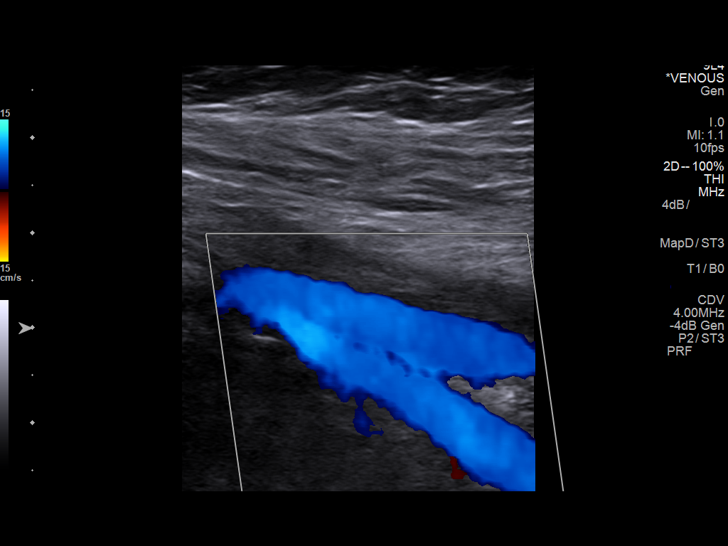
[im 15/43]
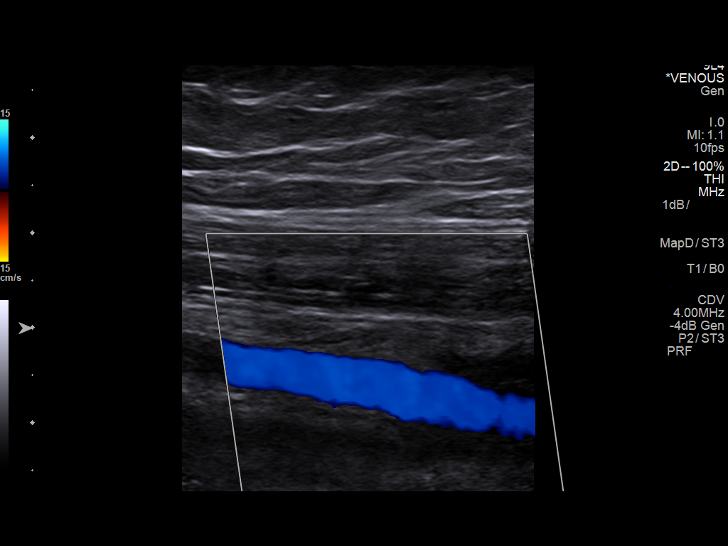
[im 19/43]
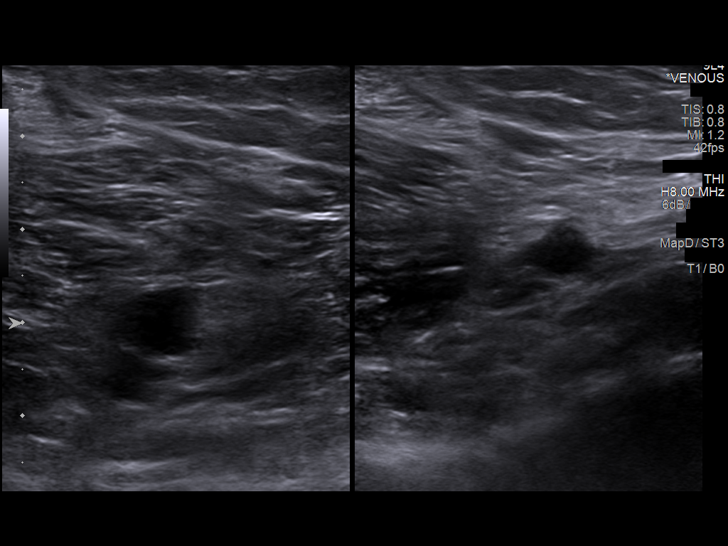
[im 22/43]
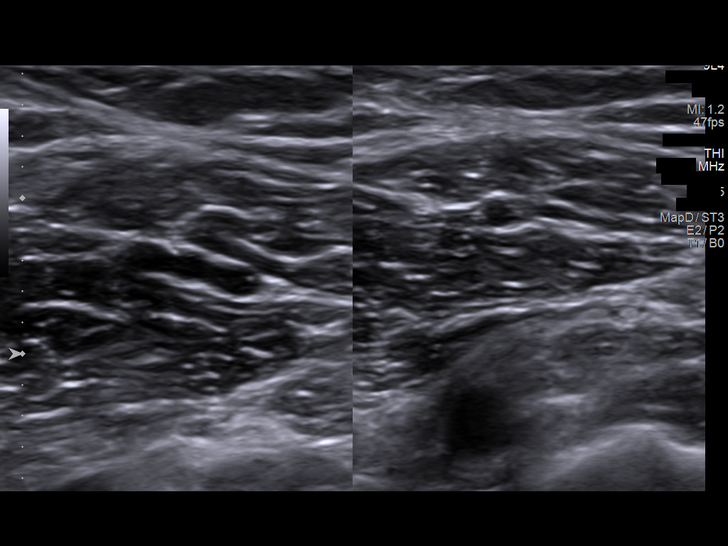
[im 24/43]
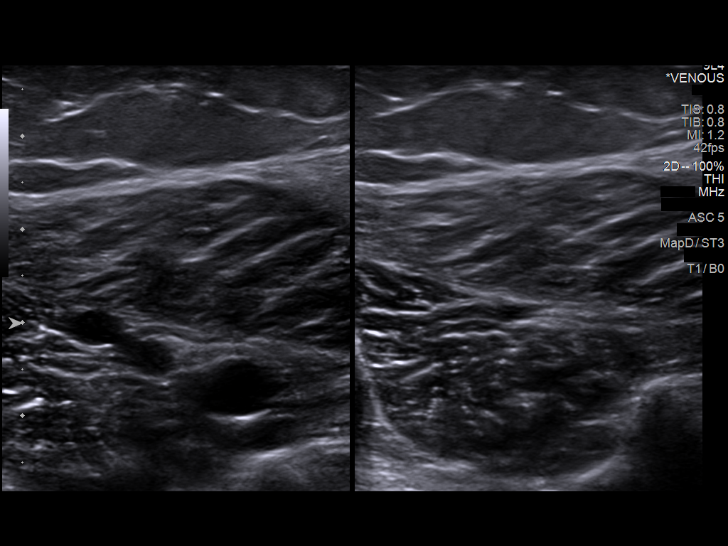
[im 28/43]
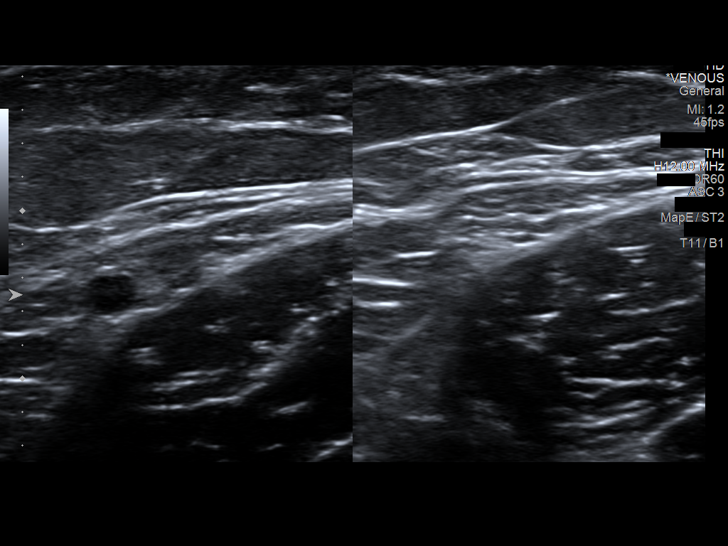
[im 32/43]
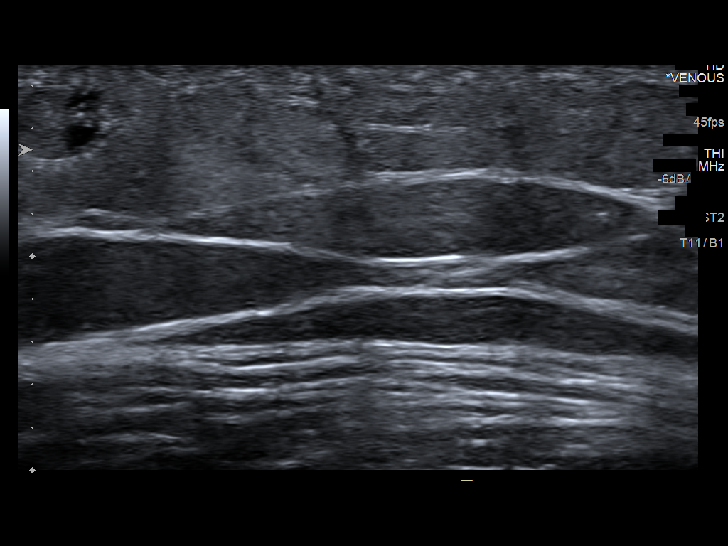
[im 35/43]
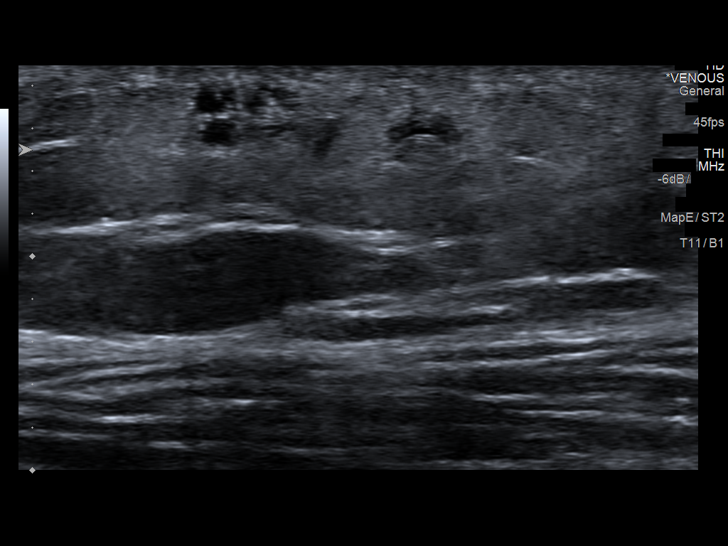
[im 39/43]
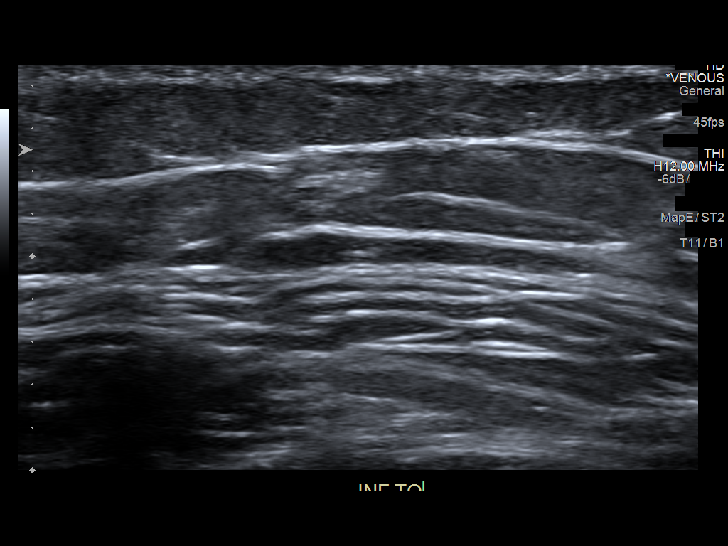
[im 43/43]
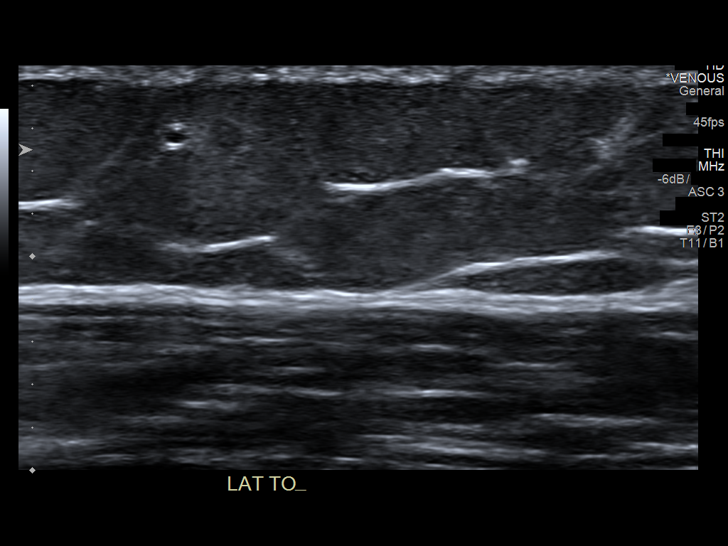

[13 of 24 positions shown; findings below may reference images not displayed]

FINDINGS: Contralateral Common Femoral Vein: Respiratory phasicity is normal
and symmetric with the symptomatic side. No evidence of thrombus.
Normal compressibility.

Common Femoral Vein: No evidence of thrombus. Normal
compressibility, respiratory phasicity and response to augmentation.

Saphenofemoral Junction: No evidence of thrombus. Normal
compressibility and flow on color Doppler imaging.

Profunda Femoral Vein: No evidence of thrombus. Normal
compressibility and flow on color Doppler imaging.

Femoral Vein: No evidence of thrombus. Normal compressibility,
respiratory phasicity and response to augmentation.

Popliteal Vein: No evidence of thrombus. Normal compressibility,
respiratory phasicity and response to augmentation.

Calf Veins: No evidence of thrombus. Normal compressibility and flow
on color Doppler imaging.

Superficial Great Saphenous Vein: No evidence of thrombus. Normal
compressibility and flow on color Doppler imaging.

Venous Reflux:  None.

Other Findings: The patient's palpable area of concern within the
distal thigh demonstrates several small superficial varicosities
however all of which appear widely patent and compressible.
IMPRESSION: 1. No evidence of DVT within the right lower extremity.
2. The patient's palpable area of concern correlates with several
small patent superficial varicosities. No evidence of superficial
thrombophlebitis.
3. Otherwise, no sonographic correlate for patient's palpable area
of concern.
